# Patient Record
Sex: Male | Born: 1950 | Race: White | Hispanic: No | Marital: Single | Smoking: Never smoker
Health system: Southern US, Community
[De-identification: ages and names within clinical notes are randomized; demographics above are authoritative.]

## PROBLEM LIST (undated history)

## (undated) DIAGNOSIS — K5792 Diverticulitis of intestine, part unspecified, without perforation or abscess without bleeding: Secondary | ICD-10-CM

## (undated) DIAGNOSIS — K5140 Inflammatory polyps of colon without complications: Secondary | ICD-10-CM

## (undated) DIAGNOSIS — E785 Hyperlipidemia, unspecified: Secondary | ICD-10-CM

## (undated) DIAGNOSIS — N5201 Erectile dysfunction due to arterial insufficiency: Secondary | ICD-10-CM

## (undated) DIAGNOSIS — Z789 Other specified health status: Secondary | ICD-10-CM

## (undated) DIAGNOSIS — Z860100 Personal history of colon polyps, unspecified: Secondary | ICD-10-CM

## (undated) DIAGNOSIS — M47812 Spondylosis without myelopathy or radiculopathy, cervical region: Secondary | ICD-10-CM

## (undated) DIAGNOSIS — J310 Chronic rhinitis: Secondary | ICD-10-CM

## (undated) DIAGNOSIS — N529 Male erectile dysfunction, unspecified: Secondary | ICD-10-CM

## (undated) DIAGNOSIS — M549 Dorsalgia, unspecified: Secondary | ICD-10-CM

## (undated) DIAGNOSIS — K219 Gastro-esophageal reflux disease without esophagitis: Secondary | ICD-10-CM

## (undated) DIAGNOSIS — N200 Calculus of kidney: Secondary | ICD-10-CM

## (undated) DIAGNOSIS — K5730 Diverticulosis of large intestine without perforation or abscess without bleeding: Secondary | ICD-10-CM

## (undated) DIAGNOSIS — D126 Benign neoplasm of colon, unspecified: Secondary | ICD-10-CM

## (undated) DIAGNOSIS — M19072 Primary osteoarthritis, left ankle and foot: Secondary | ICD-10-CM

## (undated) DIAGNOSIS — M5136 Other intervertebral disc degeneration, lumbar region: Secondary | ICD-10-CM

## (undated) DIAGNOSIS — R55 Syncope and collapse: Secondary | ICD-10-CM

## (undated) DIAGNOSIS — E119 Type 2 diabetes mellitus without complications: Secondary | ICD-10-CM

## (undated) DIAGNOSIS — I10 Essential (primary) hypertension: Secondary | ICD-10-CM

## (undated) DIAGNOSIS — M51369 Other intervertebral disc degeneration, lumbar region without mention of lumbar back pain or lower extremity pain: Secondary | ICD-10-CM

## (undated) DIAGNOSIS — I259 Chronic ischemic heart disease, unspecified: Secondary | ICD-10-CM

## (undated) DIAGNOSIS — R931 Abnormal findings on diagnostic imaging of heart and coronary circulation: Secondary | ICD-10-CM

## (undated) DIAGNOSIS — R079 Chest pain, unspecified: Secondary | ICD-10-CM

## (undated) HISTORY — DX: Male erectile dysfunction, unspecified: N52.9

## (undated) HISTORY — PX: ROTATOR CUFF REPAIR: SHX139

## (undated) HISTORY — DX: Other intervertebral disc degeneration, lumbar region without mention of lumbar back pain or lower extremity pain: M51.369

## (undated) HISTORY — PX: EYE SURGERY: SHX253

## (undated) HISTORY — DX: Inflammatory polyps of colon without complications: K51.40

## (undated) HISTORY — DX: Syncope and collapse: R55

## (undated) HISTORY — DX: Benign neoplasm of colon, unspecified: D12.6

## (undated) HISTORY — DX: Primary osteoarthritis, left ankle and foot: M19.072

## (undated) HISTORY — DX: Dorsalgia, unspecified: M54.9

## (undated) HISTORY — DX: Hyperlipidemia, unspecified: E78.5

## (undated) HISTORY — DX: Diverticulosis of large intestine without perforation or abscess without bleeding: K57.30

## (undated) HISTORY — DX: Abnormal findings on diagnostic imaging of heart and coronary circulation: R93.1

## (undated) HISTORY — PX: CARPAL TUNNEL RELEASE: SHX101

## (undated) HISTORY — DX: Other intervertebral disc degeneration, lumbar region: M51.36

## (undated) HISTORY — DX: Gastro-esophageal reflux disease without esophagitis: K21.9

## (undated) HISTORY — DX: Calculus of kidney: N20.0

## (undated) HISTORY — DX: Chronic rhinitis: J31.0

## (undated) HISTORY — DX: Chronic ischemic heart disease, unspecified: I25.9

## (undated) HISTORY — DX: Personal history of colonic polyps: Z86.010

## (undated) HISTORY — DX: Chest pain, unspecified: R07.9

## (undated) HISTORY — DX: Spondylosis without myelopathy or radiculopathy, cervical region: M47.812

## (undated) HISTORY — DX: Essential (primary) hypertension: I10

## (undated) HISTORY — DX: Other specified health status: Z78.9

## (undated) HISTORY — DX: Personal history of colon polyps, unspecified: Z86.0100

## (undated) HISTORY — DX: Erectile dysfunction due to arterial insufficiency: N52.01

---

## 2001-11-11 ENCOUNTER — Emergency Department (HOSPITAL_COMMUNITY): Admission: EM | Admit: 2001-11-11 | Discharge: 2001-11-12 | Payer: Self-pay | Admitting: *Deleted

## 2001-11-12 ENCOUNTER — Encounter: Payer: Self-pay | Admitting: *Deleted

## 2002-10-27 ENCOUNTER — Ambulatory Visit (HOSPITAL_COMMUNITY): Admission: RE | Admit: 2002-10-27 | Discharge: 2002-10-27 | Payer: Self-pay | Admitting: Gastroenterology

## 2002-10-27 ENCOUNTER — Encounter (INDEPENDENT_AMBULATORY_CARE_PROVIDER_SITE_OTHER): Payer: Self-pay | Admitting: *Deleted

## 2003-02-09 ENCOUNTER — Ambulatory Visit (HOSPITAL_COMMUNITY): Admission: RE | Admit: 2003-02-09 | Discharge: 2003-02-09 | Payer: Self-pay | Admitting: *Deleted

## 2003-02-09 ENCOUNTER — Encounter (INDEPENDENT_AMBULATORY_CARE_PROVIDER_SITE_OTHER): Payer: Self-pay | Admitting: Specialist

## 2003-09-10 ENCOUNTER — Emergency Department (HOSPITAL_COMMUNITY): Admission: EM | Admit: 2003-09-10 | Discharge: 2003-09-10 | Payer: Self-pay | Admitting: Emergency Medicine

## 2003-09-10 ENCOUNTER — Encounter: Payer: Self-pay | Admitting: *Deleted

## 2005-09-21 ENCOUNTER — Encounter: Admission: RE | Admit: 2005-09-21 | Discharge: 2005-09-21 | Payer: Self-pay | Admitting: Internal Medicine

## 2007-01-05 ENCOUNTER — Ambulatory Visit (HOSPITAL_BASED_OUTPATIENT_CLINIC_OR_DEPARTMENT_OTHER): Admission: RE | Admit: 2007-01-05 | Discharge: 2007-01-05 | Payer: Self-pay | Admitting: Orthopedic Surgery

## 2010-10-23 ENCOUNTER — Ambulatory Visit (HOSPITAL_COMMUNITY): Admission: RE | Admit: 2010-10-23 | Discharge: 2010-10-23 | Payer: Self-pay | Admitting: Gastroenterology

## 2011-04-24 NOTE — Op Note (Signed)
   NAME:  Christian Robles, Christian Robles                         ACCOUNT NO.:  [id number]   MEDICAL RECORD NO.:  [id number]                   PATIENT TYPE:  AMB   LOCATION:  ENDO                                 FACILITY:  [facility]   PHYSICIAN:  Danise Edge, M.D.                DATE OF BIRTH:  Jun 27, 1951   DATE OF PROCEDURE:  02/09/2003  DATE OF DISCHARGE:                                 OPERATIVE REPORT   PROCEDURE:  Flexible proctosigmoidoscopy with polypectomy.   INDICATIONS:  The patient is a 60 year old male born 05-01-2051.  Approximately  four months ago he underwent his first screening colonoscopy and a 2 cm  pedunculated neoplastic polyp was removed from the sigmoid colon at  approximately 25 cm from the anal verge.  There was no cancer within the  polyp pathologically.  Follow-up flexible proctosigmoidoscopy is performed  to ensure complete removal of this large sigmoid colon polyp.   DESCRIPTION OF PROCEDURE:  Anal inspection was normal.  Digital rectal  examination was normal.  The patient received Versed 7.5 mg intravenously  and Demerol 50 mg intravenously to achieve conscious sedation for the  procedure.  The Olympus adult colonoscope was introduced into the rectum and  easily advanced to approximately 70 cm from the anal verge, at which point  solid stool was encountered.  Colonic preparation for the exam today was  excellent.   Endoscopic appearance of the rectum and colon with the endoscope advanced to  approximately 70 cm from the anal verge was completely normal except for the  presence of a 0.5 mm sessile polyp removed with the cold biopsy forceps at  40 cm from the anal verge.                                               Danise Edge, M.D.    MJ/MEDQ  D:  02/09/2003  T:  02/10/2003  Job:  [id number]

## 2011-04-24 NOTE — Op Note (Signed)
   NAME:  Christian Robles, Christian Robles                         ACCOUNT NO.:  [id number]   MEDICAL RECORD NO.:  [id number]                   PATIENT TYPE:  AMB   LOCATION:  ENDO                                 FACILITY:  [facility]   PHYSICIAN:  Danise Edge, M.D.                DATE OF BIRTH:  [birth year]   DATE OF PROCEDURE:  10/27/2002  DATE OF DISCHARGE:                                 OPERATIVE REPORT   PROCEDURE:  Colonoscopy and polypectomy.   INDICATIONS:  The patient is a 60 year old male born 2051-02-21.  The patient  is scheduled to undergo his first screening colonoscopy with polypectomy to  prevent colon cancer.   ENDOSCOPIST:  Danise Edge, M.D.   PREMEDICATION:  Versed 10 mg, fentanyl 50 mcg.   ENDOSCOPE:  Olympus pediatric colonoscope.   DESCRIPTION OF PROCEDURE:  After obtaining informed consent, the patient was  placed in the left lateral decubitus position.  I administered intravenous  fentanyl and intravenous Versed to achieve conscious sedation for the  procedure.  The patient's blood pressure, oxygen saturation, and cardiac  rhythm were monitored throughout the procedure and documented in the medical  record.   Anal inspection normal.  Digital rectal exam normal.  The Olympus pediatric  video colonoscope was introduced into the rectum and advanced to the cecum.  Colonic preparation for the exam today was excellent.   Rectum normal.   Sigmoid colon and descending colon:  At approximately 25 cm from the anal  verge, a 2 cm pedunculated polyp was removed with the electrocautery snare  and a 1 mm sessile polyp was removed with the electrocautery snare.   Splenic flexure normal.   Transverse colon normal.   Hepatic flexure normal.   Ascending colon normal.   Cecum and ileocecal valve normal.    ASSESSMENT:  From the distal sigmoid colon, at approximately 25 cm from the  anal verge, a 2 cm pedunculated polyp was removed and a 1 mm sessile polyp  was removed.                                             Danise Edge, M.D.    MJ/MEDQ  D:  10/27/2002  T:  10/27/2002  Job:  [id number]   cc:   Thora Lance, M.D.  [address]  [place]  [place] [address]  Fax: [phone]

## 2011-04-24 NOTE — Op Note (Signed)
NAMEBRITT, Christian Robles NO.:  [id number]   MEDICAL RECORD NO.:  [id number]          PATIENT TYPE:  AMB   LOCATION:  DSC                          FACILITY:  [facility]   PHYSICIAN:  Loreta Ave, M.D. DATE OF BIRTH:  [birth year]   DATE OF PROCEDURE:  01/05/2007  DATE OF DISCHARGE:                               OPERATIVE REPORT   PREOPERATIVE DIAGNOSES:  1. Right shoulder complete rotator cuff tear, status post repair of      rotator cuff 20 years ago.  2. Recurrent impingement and distal clavicle osteolysis.   POSTOPERATIVE DIAGNOSES:  1. Right shoulder complete rotator cuff tear, status post repair of      rotator cuff 20 years ago.  2. Recurrent impingement and distal clavicle osteolysis.  3. With some grade 2 and 3 chondral changes, humeral head, as well as      glenoid, and attritional tearing, anterior and inferior labrum.  4. Complete evulsion of supraspinatus tendon, but still repairable.   PROCEDURES:  1. Right shoulder exam under anesthesia, arthroscopy, debridement of      labrum, rotator cuff and glenohumeral joint.  2. Debridement of rotator cuff.  3. Acromioplasty and coracoacromial ligament release.  4. Excision of distal clavicle.  5. Mini-open repair of rotator cuff tear with FiberWire suture,      Concept repair System.   SURGEON:  Loreta Ave, M.D.   ASSISTANT:  Genene Churn. Denton Meek., present throughout the entire case.   ANESTHESIA:  General.   BLOOD LOSS:  Minimal.   SPECIMENS:  None.   CULTURES:  None.   COMPLICATIONS:  None.   DRESSING:  Soft compressive with shoulder immobilizer.   DESCRIPTION OF PROCEDURES:  The patient was brought to the operating  room and placed on the operating table in supine position.  After  adequate anesthesia had been obtained, the right shoulder was examined.  Full motion and good stability.  Placed in a beach-chair position on a  shoulder positioner and prepped and draped in the usual sterile  fashion.  Anterior, posterior and lateral portals.  Shoulder entered with a blunt  obturator and distended and inspected.  Some grade 2, mild grade 3  changes on the bottom of the glenoid and on the top of the humerus; all  of this debrided.  Fair amount of attritional tearing of the labrum,  circumferential, debrided.  Biceps tendon and biceps anchor intact.  Complete evulsion, appearing relatively subacute, of the entire  supraspinatus tendon.  Retracted a little bit more than a centimeter,  but still mobile.  Although a lot of intratendinous tearing, again, I  was very pleased that this was mobile and able to be brought out  laterally.  It was debrided,  cannula redirected subacromially.  Type 2  to 3 acromion.  Bursa resected, cuff debrided, acromioplasty to a type 1  acromion with shaver and a high-speed bur.  CA ligament released with  cautery.  Grade 4 changes and spurring of the [facility] joint.  With excision  of the distal clavicle and peri-articular spurs with a shaver and a high-  speed bur, adequacy of debridement, decompression and excision confirmed  viewing from all portals.  Instruments and fluid removed.  The portals  were closed with nylon.  A deltoid-splitting incision through the  lateral portal, avoiding the old previous anterior incision.  Subacromial space accessed.  Adequacy of decompression confirmed  digitally.  The wound irrigated.  Some roughening and bony overgrowth to  the tuberosity taken down flat with a bur.  The cuff was then mobilized  and captured with a weave, FiberWire sutures; they were all placed well  medial back into good healthy tissue.  This captured the cuff well.  It  was then brought through the series of drill holes that were then  brought out through the lateral side of the humerus utilizing the  Concept Repair System.  Arm abducted.  Sutures were all tied over the  bony bridge.  This yielded a nice, firm, watertight closure of the cuff  with  full passive motion without undue tension.  Wound irrigated.  Deltoid closed with Vicryl and the skin and subcutaneous tissues with  Vicryl.  Margins of the wound injected with Marcaine.  Sterile  compressive dressing applied.  Shoulder immobilizer applied.  Anesthesia  reversed.  Brought to the recovery room.  Tolerated surgery well with no  complications.      Loreta Ave, M.D.  Electronically Signed     DFM/MEDQ  D:  01/05/2007  T:  01/06/2007  Job:  [id number]

## 2014-05-30 ENCOUNTER — Encounter: Payer: Self-pay | Admitting: *Deleted

## 2014-05-30 DIAGNOSIS — R079 Chest pain, unspecified: Secondary | ICD-10-CM

## 2014-05-30 DIAGNOSIS — R55 Syncope and collapse: Secondary | ICD-10-CM

## 2015-10-10 ENCOUNTER — Other Ambulatory Visit: Payer: Self-pay | Admitting: Gastroenterology

## 2015-10-28 ENCOUNTER — Encounter (HOSPITAL_COMMUNITY): Payer: Self-pay | Admitting: *Deleted

## 2015-10-28 NOTE — Progress Notes (Signed)
10-28-15 1155 Spoke with "Denell" Dr. Durenda Age office to informs multiple phones messages x 6, unable to get pt to return a call, now all today attempts line busy. She stated she would attempt to reach pt and have him call us.

## 2015-10-29 ENCOUNTER — Ambulatory Visit (HOSPITAL_COMMUNITY)
Admission: RE | Admit: 2015-10-29 | Discharge: 2015-10-29 | Disposition: A | Discharge status: Home / Self Care | Payer: 59 | Source: Ambulatory Visit | Attending: Gastroenterology | Admitting: Gastroenterology

## 2015-10-29 ENCOUNTER — Encounter (HOSPITAL_COMMUNITY): Payer: Self-pay

## 2015-10-29 ENCOUNTER — Encounter (HOSPITAL_COMMUNITY)
Admission: RE | Disposition: A | Discharge status: Home / Self Care | Payer: Self-pay | Source: Ambulatory Visit | Attending: Gastroenterology

## 2015-10-29 ENCOUNTER — Ambulatory Visit (HOSPITAL_COMMUNITY): Payer: 59 | Admitting: Anesthesiology

## 2015-10-29 DIAGNOSIS — K219 Gastro-esophageal reflux disease without esophagitis: Secondary | ICD-10-CM | POA: Diagnosis not present

## 2015-10-29 DIAGNOSIS — M1990 Unspecified osteoarthritis, unspecified site: Secondary | ICD-10-CM | POA: Diagnosis not present

## 2015-10-29 DIAGNOSIS — D123 Benign neoplasm of transverse colon: Secondary | ICD-10-CM | POA: Insufficient documentation

## 2015-10-29 DIAGNOSIS — Z1211 Encounter for screening for malignant neoplasm of colon: Secondary | ICD-10-CM | POA: Insufficient documentation

## 2015-10-29 DIAGNOSIS — K635 Polyp of colon: Secondary | ICD-10-CM | POA: Diagnosis not present

## 2015-10-29 DIAGNOSIS — Z86010 Personal history of colonic polyps: Secondary | ICD-10-CM | POA: Diagnosis not present

## 2015-10-29 HISTORY — PX: COLONOSCOPY WITH PROPOFOL: SHX5780

## 2015-10-29 SURGERY — COLONOSCOPY WITH PROPOFOL
Anesthesia: Monitor Anesthesia Care

## 2015-10-29 MED ORDER — PROPOFOL 10 MG/ML IV BOLUS
INTRAVENOUS | Status: DC | PRN
Start: 2015-10-29 — End: 2015-10-29
  Administered 2015-10-29 (×3): 20 mg via INTRAVENOUS

## 2015-10-29 MED ORDER — PROPOFOL 500 MG/50ML IV EMUL
INTRAVENOUS | Status: DC | PRN
  Administered 2015-10-29: 200 ug/kg/min via INTRAVENOUS

## 2015-10-29 MED ORDER — LACTATED RINGERS IV SOLN
INTRAVENOUS | Status: DC
  Administered 2015-10-29: 1000 mL via INTRAVENOUS

## 2015-10-29 MED ORDER — SODIUM CHLORIDE 0.9 % IV SOLN: INTRAVENOUS | Status: DC

## 2015-10-29 MED ORDER — PROPOFOL 10 MG/ML IV BOLUS
INTRAVENOUS | Status: AC
Start: 2015-10-29 — End: 2015-10-29
  Filled 2015-10-29: qty 40

## 2015-10-29 MED ORDER — LIDOCAINE HCL (CARDIAC) 20 MG/ML IV SOLN
INTRAVENOUS | Status: AC
  Filled 2015-10-29: qty 15

## 2015-10-29 MED ORDER — PHENYLEPHRINE HCL 10 MG/ML IJ SOLN
INTRAMUSCULAR | Status: DC | PRN
  Administered 2015-10-29: 80 ug via INTRAVENOUS

## 2015-10-29 MED ORDER — PROPOFOL 10 MG/ML IV BOLUS
INTRAVENOUS | Status: AC
  Filled 2015-10-29: qty 20

## 2015-10-29 SURGICAL SUPPLY — 21 items

## 2015-10-29 NOTE — Anesthesia Preprocedure Evaluation (Addendum)
Anesthesia Evaluation  Patient identified by MRN, date of birth, ID band Patient awake    Reviewed: Allergy & Precautions, NPO status , Patient's Chart, lab work & pertinent test results  Airway Mallampati: II  TM Distance: >3 FB Neck ROM: Full    Dental  (+) Teeth Intact, Dental Advisory Given   Pulmonary neg pulmonary ROS,    Pulmonary exam normal breath sounds clear to auscultation       Cardiovascular Exercise Tolerance: Good (-) hypertension(-) angina(-) CAD and (-) CHF negative cardio ROS Normal cardiovascular exam Rhythm:Regular Rate:Normal     Neuro/Psych History of syncope negative psych ROS   GI/Hepatic negative GI ROS, Neg liver ROS, GERD  Medicated,  Endo/Other  negative endocrine ROS  Renal/GU negative Renal ROS     Musculoskeletal  (+) Arthritis , Osteoarthritis,    Abdominal   Peds  Hematology negative hematology ROS (+)   Anesthesia Other Findings Day of surgery medications reviewed with the patient.  Reproductive/Obstetrics                            Anesthesia Physical Anesthesia Plan  ASA: II  Anesthesia Plan: MAC   Post-op Pain Management:    Induction: Intravenous  Airway Management Planned: Nasal Cannula  Additional Equipment:   Intra-op Plan:   Post-operative Plan:   Informed Consent: I have reviewed the patients History and Physical, chart, labs and discussed the procedure including the risks, benefits and alternatives for the proposed anesthesia with the patient or authorized representative who has indicated his/her understanding and acceptance.   Dental advisory given  Plan Discussed with: CRNA and Anesthesiologist  Anesthesia Plan Comments: (Discussed risks/benefits/alternatives to MAC sedation including need for ventilatory support, hypotension, need for conversion to general anesthesia.  All patient questions answered.  Patient wished to  proceed.)        Anesthesia Quick Evaluation

## 2015-10-29 NOTE — Op Note (Signed)
Procedure: Surveillance colonoscopy. 10/23/2010 colonoscopy performed with removal of a 5 mm adenomatous colon polyp  Endoscopist: Earle Gell  Premedication: Propofol administered by anesthesia  Procedure: The patient was placed in the left lateral decubitus position. Anal inspection and digital rectal exam were normal. The Pentax pediatric colonoscope was introduced into the rectum and advanced to the cecum. A normal-appearing appendiceal orifice and ileocecal valve were identified. Colonic preparation for the exam today was good. Withdrawal time was 12 minutes  Rectum. Normal. Retroflexed view of the distal rectum was normal  Sigmoid colon. A 3 mm sessile polyp was removed with the cold biopsy forceps  Descending colon. Normal  Splenic flexure. Normal  Transverse colon. A 3 mm sessile polyp was removed from the mid transverse colon with the cold biopsy forceps  Hepatic flexure. A 5 mm sessile polyp was removed with the cold snare  Ascending colon. Normal  Cecum and ileocecal valve. Normal  Assessment: A diminutive polyp was removed from the sigmoid colon, a diminutive polyp was removed from the transverse colon, a small polyp was removed from the hepatic flexure.  Recommendation: Schedule surveillance colonoscopy in 5 years

## 2015-10-29 NOTE — Anesthesia Postprocedure Evaluation (Signed)
Anesthesia Post Note  Patient: Christian Robles  Procedure(s) Performed: Procedure(s) (LRB): COLONOSCOPY WITH PROPOFOL (N/A)  Patient location during evaluation: PACU Anesthesia Type: MAC Level of consciousness: awake and alert Pain management: pain level controlled Vital Signs Assessment: post-procedure vital signs reviewed and stable Respiratory status: spontaneous breathing, nonlabored ventilation, respiratory function stable and patient connected to nasal cannula oxygen Cardiovascular status: stable and blood pressure returned to baseline Anesthetic complications: no    Last Vitals:  Filed Vitals:   10/29/15 1010 10/29/15 1020  BP: 103/76 117/71  Pulse: 65 62  Temp:    Resp: 24 14    Last Pain: There were no vitals filed for this visit.               Catalina Gravel

## 2015-10-29 NOTE — H&P (Signed)
  Procedure: Surveillance colonoscopy. 10/23/2010 colonoscopy was performed with removal of a 5 mm adenomatous colon polyp  History: The patient is a 64 year old male born Apr 12, 1951. He is scheduled to undergo a surveillance colonoscopy today.  Medication allergies: None  Past medical history: Bilateral carpal tunnel release surgeries. Bilateral rotator cuff surgeries. Gastroesophageal reflux. History of adenomatous colon polyps. Kidney stones.  Exam: The patient is alert and lying comfortably on the endoscopy stretcher. Abdomen is soft and nontender to palpation. Lungs are clear to auscultation. Cardiac exam reveals a regular rhythm.  Plan: Proceed with surveillance colonoscopy

## 2015-10-29 NOTE — Transfer of Care (Signed)
Immediate Anesthesia Transfer of Care Note  Patient: Christian Robles  Procedure(s) Performed: Procedure(s): COLONOSCOPY WITH PROPOFOL (N/A)  Patient Location: PACU  Anesthesia Type:MAC  Level of Consciousness: awake, alert  and oriented  Airway & Oxygen Therapy: Patient Spontanous Breathing and Patient connected to face mask oxygen  Post-op Assessment: Report given to RN and Post -op Vital signs reviewed and stable  Post vital signs: Reviewed and stable  Last Vitals:  Filed Vitals:   10/29/15 0853  BP: 148/84  Pulse: 68  Temp: 37.1 C  Resp: 22    Complications: No apparent anesthesia complications

## 2015-10-29 NOTE — Discharge Instructions (Signed)
Colonoscopy, Care After °These instructions give you information on caring for yourself after your procedure. Your doctor may also give you more specific instructions. Call your doctor if you have any problems or questions after your procedure. °HOME CARE °· Do not drive for 24 hours. °· Do not sign important papers or use machinery for 24 hours. °· You may shower. °· You may go back to your usual activities, but go slower for the first 24 hours. °· Take rest breaks often during the first 24 hours. °· Walk around or use warm packs on your belly (abdomen) if you have belly cramping or gas. °· Drink enough fluids to keep your pee (urine) clear or pale yellow. °· Resume your normal diet. Avoid heavy or fried foods. °· Avoid drinking alcohol for 24 hours or as told by your doctor. °· Only take medicines as told by your doctor. °If a tissue sample (biopsy) was taken during the procedure:  °· Do not take aspirin or blood thinners for 7 days, or as told by your doctor. °· Do not drink alcohol for 7 days, or as told by your doctor. °· Eat soft foods for the first 24 hours. °GET HELP IF: °You still have a small amount of blood in your poop (stool) 2-3 days after the procedure. °GET HELP RIGHT AWAY IF: °· You have more than a small amount of blood in your poop. °· You see clumps of tissue (blood clots) in your poop. °· Your belly is puffy (swollen). °· You feel sick to your stomach (nauseous) or throw up (vomit). °· You have a fever. °· You have belly pain that gets worse and medicine does not help. °MAKE SURE YOU: °· Understand these instructions. °· Will watch your condition. °· Will get help right away if you are not doing well or get worse. °  °This information is not intended to replace advice given to you by your health care provider. Make sure you discuss any questions you have with your health care provider. °  °Document Released: 12/26/2010 Document Revised: 11/28/2013 Document Reviewed: 07/31/2013 °Elsevier  Interactive Patient Education ©2016 [employer] ° °Moderate Conscious Sedation, Adult, Care After °Refer to this sheet in the next few weeks. These instructions provide you with information on caring for yourself after your procedure. Your health care provider may also give you more specific instructions. Your treatment has been planned according to current medical practices, but problems sometimes occur. Call your health care provider if you have any problems or questions after your procedure. °WHAT TO EXPECT AFTER THE PROCEDURE  °After your procedure: °· You may feel sleepy, clumsy, and have poor balance for several hours. °· Vomiting may occur if you eat too soon after the procedure. °HOME CARE INSTRUCTIONS °· Do not participate in any activities where you could become injured for at least 24 hours. Do not: °¨ Drive. °¨ Swim. °¨ Ride a bicycle. °¨ Operate heavy machinery. °¨ Cook. °¨ Use power tools. °¨ Climb ladders. °¨ Work from a high place. °· Do not make important decisions or sign legal documents until you are improved. °· If you vomit, drink water, juice, or soup when you can drink without vomiting. Make sure you have little or no nausea before eating solid foods. °· Only take over-the-counter or prescription medicines for pain, discomfort, or fever as directed by your health care provider. °· Make sure you and your family fully understand everything about the medicines given to you, including what side effects may occur. °·   You should not drink alcohol, take sleeping pills, or take medicines that cause drowsiness for at least 24 hours. °· If you smoke, do not smoke without supervision. °· If you are feeling better, you may resume normal activities 24 hours after you were sedated. °· Keep all appointments with your health care provider. °SEEK MEDICAL CARE IF: °· Your skin is pale or bluish in color. °· You continue to feel nauseous or vomit. °· Your pain is getting worse and is not helped by  medicine. °· You have bleeding or swelling. °· You are still sleepy or feeling clumsy after 24 hours. °SEEK IMMEDIATE MEDICAL CARE IF: °· You develop a rash. °· You have difficulty breathing. °· You develop any type of allergic problem. °· You have a fever. °MAKE SURE YOU: °· Understand these instructions. °· Will watch your condition. °· Will get help right away if you are not doing well or get worse. °  °This information is not intended to replace advice given to you by your health care provider. Make sure you discuss any questions you have with your health care provider. °  °Document Released: 09/13/2013 Document Revised: 12/14/2014 Document Reviewed: 09/13/2013 °Elsevier Interactive Patient Education ©2016 [employer] ° ° °

## 2015-10-30 ENCOUNTER — Encounter (HOSPITAL_COMMUNITY): Payer: Self-pay | Admitting: Gastroenterology

## 2017-06-10 DIAGNOSIS — Z7984 Long term (current) use of oral hypoglycemic drugs: Secondary | ICD-10-CM | POA: Diagnosis not present

## 2017-06-10 DIAGNOSIS — E119 Type 2 diabetes mellitus without complications: Secondary | ICD-10-CM | POA: Diagnosis not present

## 2017-06-14 DIAGNOSIS — Z7984 Long term (current) use of oral hypoglycemic drugs: Secondary | ICD-10-CM | POA: Diagnosis not present

## 2017-06-14 DIAGNOSIS — E119 Type 2 diabetes mellitus without complications: Secondary | ICD-10-CM | POA: Diagnosis not present

## 2017-06-14 DIAGNOSIS — E1165 Type 2 diabetes mellitus with hyperglycemia: Secondary | ICD-10-CM | POA: Diagnosis not present

## 2018-02-25 DIAGNOSIS — H905 Unspecified sensorineural hearing loss: Secondary | ICD-10-CM | POA: Diagnosis not present

## 2018-03-30 DIAGNOSIS — Z1389 Encounter for screening for other disorder: Secondary | ICD-10-CM | POA: Diagnosis not present

## 2018-03-30 DIAGNOSIS — Z23 Encounter for immunization: Secondary | ICD-10-CM | POA: Diagnosis not present

## 2018-03-30 DIAGNOSIS — E119 Type 2 diabetes mellitus without complications: Secondary | ICD-10-CM | POA: Diagnosis not present

## 2018-03-30 DIAGNOSIS — M19072 Primary osteoarthritis, left ankle and foot: Secondary | ICD-10-CM | POA: Diagnosis not present

## 2018-03-30 DIAGNOSIS — R079 Chest pain, unspecified: Secondary | ICD-10-CM | POA: Diagnosis not present

## 2018-03-30 DIAGNOSIS — Z125 Encounter for screening for malignant neoplasm of prostate: Secondary | ICD-10-CM | POA: Diagnosis not present

## 2018-03-30 DIAGNOSIS — M47812 Spondylosis without myelopathy or radiculopathy, cervical region: Secondary | ICD-10-CM | POA: Diagnosis not present

## 2018-03-30 DIAGNOSIS — K219 Gastro-esophageal reflux disease without esophagitis: Secondary | ICD-10-CM | POA: Diagnosis not present

## 2018-03-30 DIAGNOSIS — Z0000 Encounter for general adult medical examination without abnormal findings: Secondary | ICD-10-CM | POA: Diagnosis not present

## 2018-04-15 DIAGNOSIS — M7711 Lateral epicondylitis, right elbow: Secondary | ICD-10-CM | POA: Diagnosis not present

## 2018-04-15 DIAGNOSIS — M7701 Medial epicondylitis, right elbow: Secondary | ICD-10-CM | POA: Diagnosis not present

## 2018-04-18 DIAGNOSIS — M778 Other enthesopathies, not elsewhere classified: Secondary | ICD-10-CM | POA: Diagnosis not present

## 2018-04-20 DIAGNOSIS — M25521 Pain in right elbow: Secondary | ICD-10-CM | POA: Diagnosis not present

## 2018-04-22 DIAGNOSIS — M25521 Pain in right elbow: Secondary | ICD-10-CM | POA: Diagnosis not present

## 2018-05-12 DIAGNOSIS — G8918 Other acute postprocedural pain: Secondary | ICD-10-CM | POA: Diagnosis not present

## 2018-05-12 DIAGNOSIS — M66821 Spontaneous rupture of other tendons, right upper arm: Secondary | ICD-10-CM | POA: Diagnosis not present

## 2018-05-12 DIAGNOSIS — M66321 Spontaneous rupture of flexor tendons, right upper arm: Secondary | ICD-10-CM | POA: Diagnosis not present

## 2018-05-23 DIAGNOSIS — M66821 Spontaneous rupture of other tendons, right upper arm: Secondary | ICD-10-CM | POA: Diagnosis not present

## 2018-06-06 DIAGNOSIS — E119 Type 2 diabetes mellitus without complications: Secondary | ICD-10-CM | POA: Diagnosis not present

## 2018-06-20 DIAGNOSIS — M66821 Spontaneous rupture of other tendons, right upper arm: Secondary | ICD-10-CM | POA: Diagnosis not present

## 2018-06-22 DIAGNOSIS — M6281 Muscle weakness (generalized): Secondary | ICD-10-CM | POA: Diagnosis not present

## 2018-06-22 DIAGNOSIS — M66821 Spontaneous rupture of other tendons, right upper arm: Secondary | ICD-10-CM | POA: Diagnosis not present

## 2018-07-01 DIAGNOSIS — M6281 Muscle weakness (generalized): Secondary | ICD-10-CM | POA: Diagnosis not present

## 2018-07-01 DIAGNOSIS — M66821 Spontaneous rupture of other tendons, right upper arm: Secondary | ICD-10-CM | POA: Diagnosis not present

## 2018-07-01 DIAGNOSIS — M25521 Pain in right elbow: Secondary | ICD-10-CM | POA: Diagnosis not present

## 2018-07-08 DIAGNOSIS — M66821 Spontaneous rupture of other tendons, right upper arm: Secondary | ICD-10-CM | POA: Diagnosis not present

## 2018-07-08 DIAGNOSIS — M25521 Pain in right elbow: Secondary | ICD-10-CM | POA: Diagnosis not present

## 2018-07-08 DIAGNOSIS — M6281 Muscle weakness (generalized): Secondary | ICD-10-CM | POA: Diagnosis not present

## 2018-07-15 DIAGNOSIS — M66821 Spontaneous rupture of other tendons, right upper arm: Secondary | ICD-10-CM | POA: Diagnosis not present

## 2018-07-15 DIAGNOSIS — M25521 Pain in right elbow: Secondary | ICD-10-CM | POA: Diagnosis not present

## 2018-07-15 DIAGNOSIS — M6281 Muscle weakness (generalized): Secondary | ICD-10-CM | POA: Diagnosis not present

## 2018-07-22 DIAGNOSIS — M6281 Muscle weakness (generalized): Secondary | ICD-10-CM | POA: Diagnosis not present

## 2018-07-22 DIAGNOSIS — M25521 Pain in right elbow: Secondary | ICD-10-CM | POA: Diagnosis not present

## 2018-07-22 DIAGNOSIS — M66821 Spontaneous rupture of other tendons, right upper arm: Secondary | ICD-10-CM | POA: Diagnosis not present

## 2018-07-29 DIAGNOSIS — M6281 Muscle weakness (generalized): Secondary | ICD-10-CM | POA: Diagnosis not present

## 2018-07-29 DIAGNOSIS — M66821 Spontaneous rupture of other tendons, right upper arm: Secondary | ICD-10-CM | POA: Diagnosis not present

## 2018-07-29 DIAGNOSIS — M25521 Pain in right elbow: Secondary | ICD-10-CM | POA: Diagnosis not present

## 2018-08-01 DIAGNOSIS — M79641 Pain in right hand: Secondary | ICD-10-CM | POA: Diagnosis not present

## 2018-08-05 DIAGNOSIS — M25521 Pain in right elbow: Secondary | ICD-10-CM | POA: Diagnosis not present

## 2018-08-05 DIAGNOSIS — M6281 Muscle weakness (generalized): Secondary | ICD-10-CM | POA: Diagnosis not present

## 2018-08-05 DIAGNOSIS — M66821 Spontaneous rupture of other tendons, right upper arm: Secondary | ICD-10-CM | POA: Diagnosis not present

## 2018-09-22 ENCOUNTER — Emergency Department (HOSPITAL_COMMUNITY): Payer: Medicare Other

## 2018-09-22 ENCOUNTER — Other Ambulatory Visit: Payer: Self-pay

## 2018-09-22 ENCOUNTER — Emergency Department (HOSPITAL_COMMUNITY)
Admission: EM | Admit: 2018-09-22 | Discharge: 2018-09-22 | Disposition: A | Discharge status: Home / Self Care | Payer: Medicare Other | Attending: Emergency Medicine | Admitting: Emergency Medicine

## 2018-09-22 ENCOUNTER — Encounter (HOSPITAL_COMMUNITY): Payer: Self-pay

## 2018-09-22 DIAGNOSIS — Z7984 Long term (current) use of oral hypoglycemic drugs: Secondary | ICD-10-CM | POA: Insufficient documentation

## 2018-09-22 DIAGNOSIS — Z79899 Other long term (current) drug therapy: Secondary | ICD-10-CM | POA: Diagnosis not present

## 2018-09-22 DIAGNOSIS — N200 Calculus of kidney: Secondary | ICD-10-CM | POA: Diagnosis not present

## 2018-09-22 DIAGNOSIS — E119 Type 2 diabetes mellitus without complications: Secondary | ICD-10-CM | POA: Insufficient documentation

## 2018-09-22 DIAGNOSIS — R1012 Left upper quadrant pain: Secondary | ICD-10-CM | POA: Diagnosis present

## 2018-09-22 DIAGNOSIS — N132 Hydronephrosis with renal and ureteral calculous obstruction: Secondary | ICD-10-CM | POA: Diagnosis not present

## 2018-09-22 HISTORY — DX: Calculus of kidney: N20.0

## 2018-09-22 HISTORY — DX: Diverticulitis of intestine, part unspecified, without perforation or abscess without bleeding: K57.92

## 2018-09-22 HISTORY — DX: Type 2 diabetes mellitus without complications: E11.9

## 2018-09-22 LAB — COMPREHENSIVE METABOLIC PANEL
ALK PHOS: 72 U/L (ref 38–126)
ALT: 26 U/L (ref 0–44)
ANION GAP: 13 (ref 5–15)
AST: 28 U/L (ref 15–41)
Albumin: 4.4 g/dL (ref 3.5–5.0)
BILIRUBIN TOTAL: 1 mg/dL (ref 0.3–1.2)
BUN: 15 mg/dL (ref 8–23)
CALCIUM: 9.5 mg/dL (ref 8.9–10.3)
CO2: 25 mmol/L (ref 22–32)
CREATININE: 1.03 mg/dL (ref 0.61–1.24)
Chloride: 101 mmol/L (ref 98–111)
GFR calc non Af Amer: >60 mL/min (ref >60)
Glucose, Bld: 202 mg/dL — ABNORMAL HIGH (ref 70–99)
Potassium: 3.8 mmol/L (ref 3.5–5.1)
Sodium: 139 mmol/L (ref 135–145)
TOTAL PROTEIN: 7.5 g/dL (ref 6.5–8.1)

## 2018-09-22 LAB — URINALYSIS, ROUTINE W REFLEX MICROSCOPIC
Bacteria, UA: NONE SEEN
Bilirubin Urine: NEGATIVE
GLUCOSE, UA: 50 mg/dL — AB
KETONES UR: NEGATIVE mg/dL
LEUKOCYTES UA: NEGATIVE
Nitrite: NEGATIVE
Protein, ur: NEGATIVE mg/dL
SPECIFIC GRAVITY, URINE: 1.015 (ref 1.005–1.030)
pH: 5 (ref 5.0–8.0)

## 2018-09-22 LAB — CBC
HCT: 47.5 % (ref 39.0–52.0)
Hemoglobin: 15.7 g/dL (ref 13.0–17.0)
MCH: 30.8 pg (ref 26.0–34.0)
MCHC: 33.1 g/dL (ref 30.0–36.0)
MCV: 93.3 fL (ref 80.0–100.0)
NRBC: 0 % (ref 0.0–0.2)
PLATELETS: 199 10*3/uL (ref 150–400)
RBC: 5.09 MIL/uL (ref 4.22–5.81)
RDW: 11.5 % (ref 11.5–15.5)
WBC: 7.1 10*3/uL (ref 4.0–10.5)

## 2018-09-22 LAB — LIPASE, BLOOD: Lipase: 30 U/L (ref 11–51)

## 2018-09-22 MED ORDER — OXYCODONE-ACETAMINOPHEN 5-325 MG PO TABS: 1.0000 | ORAL_TABLET | Freq: Four times a day (QID) | ORAL | 0 refills | Status: DC | PRN

## 2018-09-22 MED ORDER — HYDROMORPHONE HCL 1 MG/ML IJ SOLN
1.0000 mg | Freq: Once | INTRAMUSCULAR | Status: AC
  Administered 2018-09-22: 1 mg via INTRAVENOUS
  Filled 2018-09-22: qty 1

## 2018-09-22 MED ORDER — ONDANSETRON HCL 4 MG/2ML IJ SOLN
4.0000 mg | Freq: Once | INTRAMUSCULAR | Status: AC
  Administered 2018-09-22: 4 mg via INTRAVENOUS
  Filled 2018-09-22: qty 2

## 2018-09-22 MED ORDER — TAMSULOSIN HCL 0.4 MG PO CAPS: 0.4000 mg | ORAL_CAPSULE | Freq: Every day | ORAL | 0 refills | Status: AC

## 2018-09-22 MED ORDER — OXYCODONE-ACETAMINOPHEN 5-325 MG PO TABS
2.0000 | ORAL_TABLET | Freq: Once | ORAL | Status: AC
  Administered 2018-09-22: 2 via ORAL
  Filled 2018-09-22: qty 2

## 2018-09-22 MED ORDER — ONDANSETRON 4 MG PO TBDP: 4.0000 mg | ORAL_TABLET | ORAL | 0 refills | Status: DC | PRN

## 2018-09-22 MED ORDER — IBUPROFEN 600 MG PO TABS: 600.0000 mg | ORAL_TABLET | Freq: Three times a day (TID) | ORAL | 0 refills | Status: DC | PRN

## 2018-09-22 MED ORDER — SODIUM CHLORIDE 0.9 % IV SOLN: 1000.0000 mL | INTRAVENOUS | Status: DC

## 2018-09-22 MED ORDER — TAMSULOSIN HCL 0.4 MG PO CAPS
0.4000 mg | ORAL_CAPSULE | Freq: Once | ORAL | Status: AC
  Administered 2018-09-22: 0.4 mg via ORAL
  Filled 2018-09-22: qty 1

## 2018-09-22 MED ORDER — SODIUM CHLORIDE 0.9 % IV BOLUS (SEPSIS)
1000.0000 mL | Freq: Once | INTRAVENOUS | Status: AC
  Administered 2018-09-22: 1000 mL via INTRAVENOUS

## 2018-09-22 MED ORDER — KETOROLAC TROMETHAMINE 30 MG/ML IJ SOLN
30.0000 mg | Freq: Once | INTRAMUSCULAR | Status: AC
  Administered 2018-09-22: 30 mg via INTRAVENOUS
  Filled 2018-09-22: qty 1

## 2018-09-22 NOTE — ED Triage Notes (Signed)
Pt endorses left flank pain radiating around to the left stomach x 1.5 hours.Marland Kitchen Hx of kidney stones and diverticulitis states it feels more like diverticulitis.

## 2018-09-22 NOTE — ED Notes (Signed)
Attempted to discharge pt, he states he is still 9-10/10 pain;  Strainers given for home use and instructions given as to use.

## 2018-09-22 NOTE — Discharge Instructions (Signed)
1.  Take Flomax daily.  Take ibuprofen every 8 hours for baseline pain control.  Take Percocet 1-2 every 6 hours for additional pain control.  Take Zofran as needed for control of nausea. 2.  Strain your urine to look for passage of a stone. 3.  Return to the emergency department if you have worsening pain or changing symptoms.

## 2018-09-22 NOTE — ED Provider Notes (Addendum)
[facility] EMERGENCY DEPARTMENT Provider Note   CSN: [id number] Arrival date & time: 09/22/18  0754     History   Chief Complaint Chief Complaint  Patient presents with  . Flank Pain    HPI Christian Robles is a 67 y.o. male.  HPI Patient had sudden onset of severe left flank pain about an hour and half prior to arrival.  He felt fine yesterday and was having no symptoms.  He reports no pain or burning with urination.  No fever no chills.  No testicular pain.  Last kidney stone was many years ago. Past Medical History:  Diagnosis Date  . Chest pain   . Diabetes mellitus without complication ([place])   . Diverticulitis   . Erectile dysfunction   . Esophageal reflux   . Kidney stone   . Syncope     Patient Active Problem List   Diagnosis Date Noted  . Chest pain   . Syncope     Past Surgical History:  Procedure Laterality Date  . CARPAL TUNNEL RELEASE    . COLONOSCOPY WITH PROPOFOL N/A 10/29/2015   Procedure: COLONOSCOPY WITH PROPOFOL;  Surgeon: Garlan Fair, MD;  Location: [facility];  Service: Endoscopy;  Laterality: N/A;  . EYE SURGERY    . ROTATOR CUFF REPAIR          Home Medications    Prior to Admission medications   Medication Sig Start Date End Date Taking? Authorizing Provider  metFORMIN (GLUCOPHAGE) 500 MG tablet Take 500 mg by mouth daily. 09/14/18  Yes [provider]  Multiple Vitamins-Minerals (MULTIVITAMIN PO) Take 1 packet by mouth See admin instructions. Multivitamin-minerals packet from [facility]   Yes [provider]  omeprazole (PRILOSEC OTC) 20 MG tablet Take 10 mg by mouth daily.   Yes [provider]  ibuprofen (ADVIL,MOTRIN) 600 MG tablet Take 1 tablet (600 mg total) by mouth every 8 (eight) hours as needed. 09/22/18   Charlesetta Shanks, MD  ondansetron (ZOFRAN ODT) 4 MG disintegrating tablet Take 1 tablet (4 mg total) by mouth every 4 (four) hours as needed for nausea or vomiting. 09/22/18    Charlesetta Shanks, MD  oxyCODONE-acetaminophen (PERCOCET) 5-325 MG tablet Take 1-2 tablets by mouth every 6 (six) hours as needed. 09/22/18   Charlesetta Shanks, MD  tamsulosin (FLOMAX) 0.4 MG CAPS capsule Take 1 capsule (0.4 mg total) by mouth daily. 09/22/18   Charlesetta Shanks, MD    Family History Family History  Family history unknown: Yes    Social History Social History   Tobacco Use  . Smoking status: Never Smoker  Substance Use Topics  . Alcohol use: No  . Drug use: No     Allergies   Patient has no known allergies.   Review of Systems Review of Systems 10 Systems reviewed and are negative for acute change except as noted in the HPI.   Physical Exam Updated Vital Signs BP (!) 145/90   Pulse 60   Temp 97.7 F (36.5 C) (Oral)   Resp 20   Ht 6' (1.829 m)   Wt 93.4 kg   SpO2 96%   BMI 27.94 kg/m   Physical Exam  Constitutional: He is oriented to person, place, and time. He appears well-developed and well-nourished.  Patient is clinically well in appearance.  He is nontoxic and alert.  Well-nourished well-developed.  He is up pacing around the room in pain.  HENT:  Head: Normocephalic and atraumatic.  Eyes: EOM are normal.  Cardiovascular: Normal rate, regular rhythm, normal heart sounds and intact distal pulses.  Pulmonary/Chest: Effort normal and breath sounds normal.  Abdominal: Soft. He exhibits no distension. There is no tenderness. There is no guarding.  Musculoskeletal: Normal range of motion. He exhibits no edema or tenderness.  Neurological: He is alert and oriented to person, place, and time. No cranial nerve deficit. He exhibits normal muscle tone. Coordination normal.  Skin: Skin is warm and dry.  Psychiatric: He has a normal mood and affect.     ED Treatments / Results  Labs (all labs ordered are listed, but only abnormal results are displayed) Labs Reviewed  COMPREHENSIVE METABOLIC PANEL - Abnormal; Notable for the following components:       Result Value   Glucose, Bld 202 (*)    All other components within normal limits  URINALYSIS, ROUTINE W REFLEX MICROSCOPIC - Abnormal; Notable for the following components:   Glucose, UA 50 (*)    Hgb urine dipstick LARGE (*)    RBC / HPF >50 (*)    All other components within normal limits  LIPASE, BLOOD  CBC    EKG None  Radiology Ct Renal Stone Study  Result Date: 09/22/2018 CLINICAL DATA:  Flank pain EXAM: CT ABDOMEN AND PELVIS WITHOUT CONTRAST TECHNIQUE: Multidetector CT imaging of the abdomen and pelvis was performed following the standard protocol without IV contrast. COMPARISON:  None. FINDINGS: Lower chest: Lung bases are clear. No effusions. Heart is normal size. Hepatobiliary: No focal hepatic abnormality. Gallbladder unremarkable. Pancreas: No focal abnormality or ductal dilatation. Spleen: No focal abnormality.  Normal size. Adrenals/Urinary Tract: Moderate left hydronephrosis due to 2 mm left UVJ stone. No hydronephrosis on the right. Areas of cortical thinning and scarring within the left kidney. Moderate perinephric stranding around the left kidney. Adrenal glands and urinary bladder unremarkable. Stomach/Bowel: Appendix is normal. Moderate stool throughout the colon. Stomach, large and small bowel grossly unremarkable. Vascular/Lymphatic: Aortic atherosclerosis. No enlarged abdominal or pelvic lymph nodes. Reproductive: Prostate enlargement. Other: No free fluid or free air. Musculoskeletal: Fusion across the SI joints bilaterally. Degenerative disc and facet disease in the lower lumbar spine. IMPRESSION: Moderate left hydronephrosis and hydroureter with perinephric stranding due to a 2 mm left UVJ stone. Moderate stool burden throughout the colon. Aortic atherosclerosis. Prostate enlargement. Electronically Signed   By: Rolm Baptise M.D.   On: 09/22/2018 09:32    Procedures Procedures (including critical care time)  Medications Ordered in ED Medications    oxyCODONE-acetaminophen (PERCOCET/ROXICET) 5-325 MG per tablet 2 tablet (has no administration in time range)  HYDROmorphone (DILAUDID) injection 1 mg (1 mg Intravenous Given 09/22/18 0842)  ketorolac (TORADOL) 30 MG/ML injection 30 mg (30 mg Intravenous Given 09/22/18 0842)  ondansetron (ZOFRAN) injection 4 mg (4 mg Intravenous Given 09/22/18 0842)  HYDROmorphone (DILAUDID) injection 1 mg (1 mg Intravenous Given 09/22/18 1013)     Initial Impression / Assessment and Plan / ED Course  I have reviewed the triage vital signs and the nursing notes.  Pertinent labs & imaging results that were available during my care of the patient were reviewed by me and considered in my medical decision making (see chart for details).  Clinical Course as of Sep 22 1406  Thu Sep 22, 2018  1044 Consult to urology ordered   [MP]  1053 Consult: Dr. Karsten Ro has reviewed the CT results.  Appropriate for starting the patient on tamsulosin and pain control with follow-up.  Anticipated the patient will pass the stone spontaneously.   [  MP]  1122 Patient was pain control.  He however has had a rebound and pain is now up at bedside again with fairly severe pain.  Will have to cancel discharge plan and administer fluids and additional narcotic pain control.   [MP]    Clinical Course User Index [MP] Charlesetta Shanks, MD   Patient arrived with typical presentation for kidney stone.  This is confirmed by CT.  Patient was hypertensive.  He was however significant pain.  He reports at baseline his pressures are fairly well controlled or just borderline for hypertension without medications.  He is going well before onset of the symptoms with no signs of endorgan damage.  Potentially appears secondary to severe pain.  It did improve once patient was well controlled for pain.  Plan will be for outpatient pain medications and follow-up with urology.  Patient reports he has an appointment with his primary care doctor upcoming  Monday.  Return precautions reviewed.  Final Clinical Impressions(s) / ED Diagnoses   Final diagnoses:  Kidney stone   Patient had a episode of rebound and pain at time of planned discharge.  Required additional pain control.  After observation and patient urinating freely, patient is now pain controlled and ready for discharge. ED Discharge Orders         Ordered    tamsulosin (FLOMAX) 0.4 MG CAPS capsule  Daily     09/22/18 1059    oxyCODONE-acetaminophen (PERCOCET) 5-325 MG tablet  Every 6 hours PRN     09/22/18 1059    ibuprofen (ADVIL,MOTRIN) 600 MG tablet  Every 8 hours PRN     09/22/18 1059    ondansetron (ZOFRAN ODT) 4 MG disintegrating tablet  Every 4 hours PRN     09/22/18 1059           Charlesetta Shanks, MD 09/22/18 1105    Charlesetta Shanks, MD 09/22/18 1408

## 2018-09-26 DIAGNOSIS — R030 Elevated blood-pressure reading, without diagnosis of hypertension: Secondary | ICD-10-CM | POA: Diagnosis not present

## 2018-09-26 DIAGNOSIS — E119 Type 2 diabetes mellitus without complications: Secondary | ICD-10-CM | POA: Diagnosis not present

## 2018-10-03 DIAGNOSIS — N401 Enlarged prostate with lower urinary tract symptoms: Secondary | ICD-10-CM | POA: Diagnosis not present

## 2018-10-03 DIAGNOSIS — R351 Nocturia: Secondary | ICD-10-CM | POA: Diagnosis not present

## 2018-10-03 DIAGNOSIS — N201 Calculus of ureter: Secondary | ICD-10-CM | POA: Diagnosis not present

## 2018-11-07 DIAGNOSIS — E119 Type 2 diabetes mellitus without complications: Secondary | ICD-10-CM | POA: Diagnosis not present

## 2018-11-07 DIAGNOSIS — Z9189 Other specified personal risk factors, not elsewhere classified: Secondary | ICD-10-CM | POA: Diagnosis not present

## 2018-11-07 DIAGNOSIS — I10 Essential (primary) hypertension: Secondary | ICD-10-CM | POA: Diagnosis not present

## 2019-06-28 DIAGNOSIS — Z7984 Long term (current) use of oral hypoglycemic drugs: Secondary | ICD-10-CM | POA: Diagnosis not present

## 2019-06-28 DIAGNOSIS — E119 Type 2 diabetes mellitus without complications: Secondary | ICD-10-CM | POA: Diagnosis not present

## 2019-06-28 DIAGNOSIS — Z0000 Encounter for general adult medical examination without abnormal findings: Secondary | ICD-10-CM | POA: Diagnosis not present

## 2019-06-28 DIAGNOSIS — I10 Essential (primary) hypertension: Secondary | ICD-10-CM | POA: Diagnosis not present

## 2019-06-28 DIAGNOSIS — Z1389 Encounter for screening for other disorder: Secondary | ICD-10-CM | POA: Diagnosis not present

## 2019-10-27 DIAGNOSIS — Z23 Encounter for immunization: Secondary | ICD-10-CM | POA: Diagnosis not present

## 2019-10-27 DIAGNOSIS — E1169 Type 2 diabetes mellitus with other specified complication: Secondary | ICD-10-CM | POA: Diagnosis not present

## 2019-10-27 DIAGNOSIS — I10 Essential (primary) hypertension: Secondary | ICD-10-CM | POA: Diagnosis not present

## 2019-10-27 DIAGNOSIS — K219 Gastro-esophageal reflux disease without esophagitis: Secondary | ICD-10-CM | POA: Diagnosis not present

## 2019-10-27 DIAGNOSIS — Z125 Encounter for screening for malignant neoplasm of prostate: Secondary | ICD-10-CM | POA: Diagnosis not present

## 2019-11-17 DIAGNOSIS — E119 Type 2 diabetes mellitus without complications: Secondary | ICD-10-CM | POA: Diagnosis not present

## 2020-02-28 DIAGNOSIS — I10 Essential (primary) hypertension: Secondary | ICD-10-CM | POA: Diagnosis not present

## 2020-02-28 DIAGNOSIS — E785 Hyperlipidemia, unspecified: Secondary | ICD-10-CM | POA: Diagnosis not present

## 2020-02-28 DIAGNOSIS — E119 Type 2 diabetes mellitus without complications: Secondary | ICD-10-CM | POA: Diagnosis not present

## 2020-02-28 DIAGNOSIS — Z7984 Long term (current) use of oral hypoglycemic drugs: Secondary | ICD-10-CM | POA: Diagnosis not present

## 2020-02-28 DIAGNOSIS — K219 Gastro-esophageal reflux disease without esophagitis: Secondary | ICD-10-CM | POA: Diagnosis not present

## 2020-05-21 ENCOUNTER — Other Ambulatory Visit (HOSPITAL_BASED_OUTPATIENT_CLINIC_OR_DEPARTMENT_OTHER): Payer: Self-pay | Admitting: Internal Medicine

## 2020-05-21 ENCOUNTER — Other Ambulatory Visit (HOSPITAL_COMMUNITY): Payer: Self-pay | Admitting: Internal Medicine

## 2020-05-21 ENCOUNTER — Other Ambulatory Visit: Payer: Self-pay | Admitting: Internal Medicine

## 2020-05-21 DIAGNOSIS — R42 Dizziness and giddiness: Secondary | ICD-10-CM

## 2020-05-21 DIAGNOSIS — G459 Transient cerebral ischemic attack, unspecified: Secondary | ICD-10-CM

## 2020-05-25 ENCOUNTER — Other Ambulatory Visit: Payer: Self-pay

## 2020-05-25 ENCOUNTER — Encounter (HOSPITAL_BASED_OUTPATIENT_CLINIC_OR_DEPARTMENT_OTHER): Payer: Self-pay

## 2020-05-25 ENCOUNTER — Ambulatory Visit (HOSPITAL_BASED_OUTPATIENT_CLINIC_OR_DEPARTMENT_OTHER)
Admission: RE | Admit: 2020-05-25 | Discharge: 2020-05-25 | Disposition: A | Discharge status: Home / Self Care | Payer: Medicare Other | Source: Ambulatory Visit | Attending: Internal Medicine | Admitting: Internal Medicine

## 2020-05-25 DIAGNOSIS — G459 Transient cerebral ischemic attack, unspecified: Secondary | ICD-10-CM

## 2020-05-25 DIAGNOSIS — R42 Dizziness and giddiness: Secondary | ICD-10-CM | POA: Diagnosis present

## 2020-12-13 DIAGNOSIS — Z1159 Encounter for screening for other viral diseases: Secondary | ICD-10-CM | POA: Diagnosis not present

## 2020-12-18 DIAGNOSIS — Z86010 Personal history of colonic polyps: Secondary | ICD-10-CM | POA: Diagnosis not present

## 2020-12-18 DIAGNOSIS — K621 Rectal polyp: Secondary | ICD-10-CM | POA: Diagnosis not present

## 2020-12-18 DIAGNOSIS — D123 Benign neoplasm of transverse colon: Secondary | ICD-10-CM | POA: Diagnosis not present

## 2020-12-18 DIAGNOSIS — K5730 Diverticulosis of large intestine without perforation or abscess without bleeding: Secondary | ICD-10-CM | POA: Diagnosis not present

## 2020-12-18 DIAGNOSIS — K648 Other hemorrhoids: Secondary | ICD-10-CM | POA: Diagnosis not present

## 2020-12-18 DIAGNOSIS — K5140 Inflammatory polyps of colon without complications: Secondary | ICD-10-CM | POA: Diagnosis not present

## 2020-12-20 DIAGNOSIS — K621 Rectal polyp: Secondary | ICD-10-CM | POA: Diagnosis not present

## 2020-12-20 DIAGNOSIS — D123 Benign neoplasm of transverse colon: Secondary | ICD-10-CM | POA: Diagnosis not present

## 2020-12-20 DIAGNOSIS — K5140 Inflammatory polyps of colon without complications: Secondary | ICD-10-CM | POA: Diagnosis not present

## 2021-02-17 DIAGNOSIS — I10 Essential (primary) hypertension: Secondary | ICD-10-CM | POA: Diagnosis not present

## 2021-02-17 DIAGNOSIS — K219 Gastro-esophageal reflux disease without esophagitis: Secondary | ICD-10-CM | POA: Diagnosis not present

## 2021-02-17 DIAGNOSIS — E119 Type 2 diabetes mellitus without complications: Secondary | ICD-10-CM | POA: Diagnosis not present

## 2021-02-17 DIAGNOSIS — E0869 Diabetes mellitus due to underlying condition with other specified complication: Secondary | ICD-10-CM | POA: Diagnosis not present

## 2021-02-17 DIAGNOSIS — E1169 Type 2 diabetes mellitus with other specified complication: Secondary | ICD-10-CM | POA: Diagnosis not present

## 2021-02-17 DIAGNOSIS — M19072 Primary osteoarthritis, left ankle and foot: Secondary | ICD-10-CM | POA: Diagnosis not present

## 2021-02-17 DIAGNOSIS — E785 Hyperlipidemia, unspecified: Secondary | ICD-10-CM | POA: Diagnosis not present

## 2021-02-17 DIAGNOSIS — M47812 Spondylosis without myelopathy or radiculopathy, cervical region: Secondary | ICD-10-CM | POA: Diagnosis not present

## 2021-02-19 DIAGNOSIS — H2513 Age-related nuclear cataract, bilateral: Secondary | ICD-10-CM | POA: Diagnosis not present

## 2021-03-18 ENCOUNTER — Other Ambulatory Visit: Payer: Self-pay | Admitting: Internal Medicine

## 2021-03-18 DIAGNOSIS — R079 Chest pain, unspecified: Secondary | ICD-10-CM | POA: Diagnosis not present

## 2021-03-18 DIAGNOSIS — I259 Chronic ischemic heart disease, unspecified: Secondary | ICD-10-CM

## 2021-03-18 DIAGNOSIS — Z789 Other specified health status: Secondary | ICD-10-CM | POA: Diagnosis not present

## 2021-03-18 DIAGNOSIS — E785 Hyperlipidemia, unspecified: Secondary | ICD-10-CM

## 2021-03-18 DIAGNOSIS — E119 Type 2 diabetes mellitus without complications: Secondary | ICD-10-CM | POA: Diagnosis not present

## 2021-03-18 DIAGNOSIS — I10 Essential (primary) hypertension: Secondary | ICD-10-CM | POA: Diagnosis not present

## 2021-04-03 ENCOUNTER — Other Ambulatory Visit: Payer: Self-pay

## 2021-04-03 ENCOUNTER — Ambulatory Visit
Admission: RE | Admit: 2021-04-03 | Discharge: 2021-04-03 | Disposition: A | Discharge status: Home / Self Care | Payer: No Typology Code available for payment source | Source: Ambulatory Visit | Attending: Internal Medicine | Admitting: Internal Medicine

## 2021-04-03 DIAGNOSIS — I259 Chronic ischemic heart disease, unspecified: Secondary | ICD-10-CM

## 2021-04-04 DIAGNOSIS — M47812 Spondylosis without myelopathy or radiculopathy, cervical region: Secondary | ICD-10-CM | POA: Diagnosis not present

## 2021-04-04 DIAGNOSIS — K219 Gastro-esophageal reflux disease without esophagitis: Secondary | ICD-10-CM | POA: Diagnosis not present

## 2021-04-04 DIAGNOSIS — M19072 Primary osteoarthritis, left ankle and foot: Secondary | ICD-10-CM | POA: Diagnosis not present

## 2021-04-04 DIAGNOSIS — I10 Essential (primary) hypertension: Secondary | ICD-10-CM | POA: Diagnosis not present

## 2021-04-04 DIAGNOSIS — I259 Chronic ischemic heart disease, unspecified: Secondary | ICD-10-CM | POA: Diagnosis not present

## 2021-04-04 DIAGNOSIS — E119 Type 2 diabetes mellitus without complications: Secondary | ICD-10-CM | POA: Diagnosis not present

## 2021-04-04 DIAGNOSIS — E785 Hyperlipidemia, unspecified: Secondary | ICD-10-CM | POA: Diagnosis not present

## 2021-04-07 DIAGNOSIS — I10 Essential (primary) hypertension: Secondary | ICD-10-CM | POA: Diagnosis not present

## 2021-04-07 DIAGNOSIS — E1169 Type 2 diabetes mellitus with other specified complication: Secondary | ICD-10-CM | POA: Diagnosis not present

## 2021-04-07 DIAGNOSIS — I259 Chronic ischemic heart disease, unspecified: Secondary | ICD-10-CM | POA: Diagnosis not present

## 2021-04-07 DIAGNOSIS — K219 Gastro-esophageal reflux disease without esophagitis: Secondary | ICD-10-CM | POA: Diagnosis not present

## 2021-04-07 DIAGNOSIS — E119 Type 2 diabetes mellitus without complications: Secondary | ICD-10-CM | POA: Diagnosis not present

## 2021-04-07 DIAGNOSIS — E785 Hyperlipidemia, unspecified: Secondary | ICD-10-CM | POA: Diagnosis not present

## 2021-05-14 ENCOUNTER — Encounter: Payer: Self-pay | Admitting: *Deleted

## 2021-05-14 ENCOUNTER — Encounter: Payer: Self-pay | Admitting: Interventional Cardiology

## 2021-05-14 ENCOUNTER — Other Ambulatory Visit: Payer: Self-pay

## 2021-05-14 ENCOUNTER — Ambulatory Visit (INDEPENDENT_AMBULATORY_CARE_PROVIDER_SITE_OTHER): Payer: Medicare Other | Admitting: Interventional Cardiology

## 2021-05-14 VITALS — BP 118/78 | HR 69 | Ht 72.0 in | Wt 213.6 lb

## 2021-05-14 DIAGNOSIS — I10 Essential (primary) hypertension: Secondary | ICD-10-CM

## 2021-05-14 DIAGNOSIS — Z794 Long term (current) use of insulin: Secondary | ICD-10-CM

## 2021-05-14 DIAGNOSIS — I259 Chronic ischemic heart disease, unspecified: Secondary | ICD-10-CM | POA: Diagnosis not present

## 2021-05-14 DIAGNOSIS — E1159 Type 2 diabetes mellitus with other circulatory complications: Secondary | ICD-10-CM | POA: Diagnosis not present

## 2021-05-14 DIAGNOSIS — I700 Atherosclerosis of aorta: Secondary | ICD-10-CM

## 2021-05-14 DIAGNOSIS — I2510 Atherosclerotic heart disease of native coronary artery without angina pectoris: Secondary | ICD-10-CM | POA: Diagnosis not present

## 2021-05-14 DIAGNOSIS — R072 Precordial pain: Secondary | ICD-10-CM

## 2021-05-14 DIAGNOSIS — I2584 Coronary atherosclerosis due to calcified coronary lesion: Secondary | ICD-10-CM | POA: Diagnosis not present

## 2021-05-14 NOTE — Progress Notes (Signed)
Cardiology Office Note   Date:  05/14/2021   ID:  Christian Robles 08/01/1951, MRN [id number]  PCP:  Lavone Orn, MD    No chief complaint on file.  Coronary artery calcification  Wt Readings from Last 3 Encounters:  05/14/21 213 lb 9.6 oz (96.9 kg)  09/22/18 206 lb (93.4 kg)  10/29/15 225 lb (102.1 kg)       History of Present Illness: Christian Robles is a 70 y.o. male who is being seen today for the evaluation of coronary artery calcification at the request of Lavone Orn, MD.  He had a calcium scoring study done in April 2022 showing: "Left Main: No coronary artery calcification  LAD: 490  LCx: 49  RCA: 1243  CORONARY CALCIUM  Total Agatston Score: 1782  So I am database percentile: 93  Ascending aorta (normal <  40 mm): 37 mm"   He on occasion can have some tightness in the chest with exertion.   Past Medical History:  Diagnosis Date  . Adenomatous polyp of colon   . Back pain   . Chest pain   . DDD (degenerative disc disease), lumbar   . Diabetes mellitus without complication ([place])   . Diverticulitis   . Diverticulosis of colon   . Dyslipidemia   . Elevated coronary artery calcium score   . Erectile dysfunction   . Erectile dysfunction due to arterial insufficiency   . Esophageal reflux   . GERD (gastroesophageal reflux disease)   . History of colonic polyps   . Hypertension   . 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 Ischemic heart disease   . Kidney stone   . Nephrolithiasis   . Primary osteoarthritis of left foot   . Rhinitis   . Spondylosis of cervical region without myelopathy or radiculopathy   . Statin intolerance   . Syncope     Past Surgical History:  Procedure Laterality Date  . CARPAL TUNNEL RELEASE    . COLONOSCOPY WITH PROPOFOL N/A 10/29/2015   Procedure: COLONOSCOPY WITH PROPOFOL;  Surgeon: Garlan Fair, MD;  Location: [facility];  Service: Endoscopy;  Laterality: N/A;  . EYE  SURGERY    . ROTATOR CUFF REPAIR       Current Outpatient Medications  Medication Sig Dispense Refill  . metFORMIN (GLUCOPHAGE) 500 MG tablet Take 500 mg by mouth daily.  3  . Multiple Vitamins-Minerals (MULTIVITAMIN PO) Take 1 packet by mouth See admin instructions. Multivitamin-minerals packet from [facility]    . omeprazole (PRILOSEC OTC) 20 MG tablet Take 10 mg by mouth daily.    Marland Kitchen PRALUENT 150 MG/ML SOAJ Inject into the skin.    . tamsulosin (FLOMAX) 0.4 MG CAPS capsule Take 1 capsule (0.4 mg total) by mouth daily. 30 capsule 0  . valsartan (DIOVAN) 160 MG tablet Take 160 mg by mouth daily.     No current facility-administered medications for this visit.    Allergies:   Meloxicam, Pravastatin, and Rosuvastatin    Social History:  The patient  reports that he has never smoked. He has never used smokeless tobacco. He reports that he does not drink alcohol and does not use drugs.   Family History:  The patient's family history includes Hypertension in his father and mother.    ROS:  Please see the history of present illness.   Otherwise, review of systems are positive for statin intolerance- severe weaknes with statins.   All other systems are reviewed and  negative.    PHYSICAL EXAM: VS:  BP 118/78   Pulse 69   Ht 6' (1.829 m)   Wt 213 lb 9.6 oz (96.9 kg)   SpO2 96%   BMI 28.97 kg/m  , BMI Body mass index is 28.97 kg/m. GEN: Well nourished, well developed, in no acute distress  HEENT: normal  Neck: no JVD, carotid bruits, or masses Cardiac: RRR; no murmurs, rubs, or gallops,no edema  Respiratory:  clear to auscultation bilaterally, normal work of breathing GI: soft, nontender, nondistended, + BS MS: no deformity or atrophy  Skin: warm and dry, no rash Neuro:  Strength and sensation are intact Psych: euthymic mood, full affect   EKG:   The ekg ordered today demonstrates NSR, no ST changes   Recent Labs: No results found for requested labs within last 8760 hours.    Lipid Panel No results found for: CHOL, TRIG, HDL, CHOLHDL, VLDL, LDLCALC, LDLDIRECT   Other studies Reviewed: Additional studies/ records that were reviewed today with results demonstrating: labs reviewed.   ASSESSMENT AND PLAN:  1. Coronary calcification: No clear exertional angina.  He lifts a lot at work and has not issues while there.  No problems on the elliptical as well, but does have some chest pains at more random times.  Given that he has high calcium score and coronary calcification, plan for exercise myoview.   2. Hyperlipidemia: LDL 104 in 2021.  Given statin intolerance.  Now on Praluent.  3. HTN: The current medical regimen is effective;  continue present plan and medications.  Low salt diet.  Well controled on ARB.  4. DM: A1C 7.1.  Whole food, plant based diet.  Continue metformin. 70. Father with AAA.  He has aortic atherosclerosis.  No AAA noted on CT renal stone.  Current medicines are reviewed at length with the patient today.  The patient concerns regarding his medicines were addressed.  The following changes have been made:  No change  Labs/ tests ordered today include:   Orders Placed This Encounter  Procedures  . MYOCARDIAL PERFUSION IMAGING  . EKG 12-Lead    Recommend 150 minutes/week of aerobic exercise Low fat, low carb, high fiber diet recommended  Disposition:   FU for stress test   Signed, Larae Grooms, MD  05/14/2021 4:29 PM    [facility] [place], [place], [place]  [address] Phone: [phone]; Fax: [phone]

## 2021-05-14 NOTE — Patient Instructions (Signed)
Medication Instructions:  Your physician recommends that you continue on your current medications as directed. Please refer to the Current Medication list given to you today.  *If you need a refill on your cardiac medications before your next appointment, please call your pharmacy*   Lab Work: none If you have labs (blood work) drawn today and your tests are completely normal, you will receive your results only by: Marland Kitchen MyChart Message (if you have MyChart) OR . A paper copy in the mail If you have any lab test that is abnormal or we need to change your treatment, we will call you to review the results.   Testing/Procedures: Your physician has requested that you have an exercise stress myoview. For further information please visit [email or web address]. Please follow instruction sheet, as given.    Follow-Up: At [facility], you and your health needs are our priority.  As part of our continuing mission to provide you with exceptional heart care, we have created designated Provider Care Teams.  These Care Teams include your primary Cardiologist (physician) and Advanced Practice Providers (APPs -  Physician Assistants and Nurse Practitioners) who all work together to provide you with the care you need, when you need it.  We recommend signing up for the patient portal called "MyChart".  Sign up information is provided on this After Visit Summary.  MyChart is used to connect with patients for Virtual Visits (Telemedicine).  Patients are able to view lab/test results, encounter notes, upcoming appointments, etc.  Non-urgent messages can be sent to your provider as well.   To learn more about what you can do with MyChart, go to [email or web address].    Your next appointment:   12 month(s)  The format for your next appointment:   In Person  Provider:   You may see Larae Grooms, MD or one of the following Advanced Practice Providers on your designated Care Team:    Melina Copa,  PA-C  Ermalinda Barrios, PA-C    Other Instructions

## 2021-05-20 ENCOUNTER — Telehealth (HOSPITAL_COMMUNITY): Payer: Self-pay | Admitting: *Deleted

## 2021-05-20 NOTE — Telephone Encounter (Signed)
Left message on voicemail per DPR in reference to upcoming appointment scheduled on 6/22/22with detailed instructions given per Myocardial Perfusion Study Information Sheet for the test. LM to arrive 15 minutes early, and that it is imperative to arrive on time for appointment to keep from having the test rescheduled. If you need to cancel or reschedule your appointment, please call the office within 24 hours of your appointment. Failure to do so may result in a cancellation of your appointment, and a $50 no show fee. Phone number given for call back for any questions.  Kirstie Peri

## 2021-05-21 DIAGNOSIS — M180 Bilateral primary osteoarthritis of first carpometacarpal joints: Secondary | ICD-10-CM | POA: Diagnosis not present

## 2021-05-28 ENCOUNTER — Ambulatory Visit (HOSPITAL_COMMUNITY): Payer: Medicare Other | Attending: Cardiology

## 2021-05-28 ENCOUNTER — Other Ambulatory Visit: Payer: Self-pay

## 2021-05-28 DIAGNOSIS — R072 Precordial pain: Secondary | ICD-10-CM | POA: Insufficient documentation

## 2021-05-28 LAB — MYOCARDIAL PERFUSION IMAGING
Estimated workload: 10.1 METS
Exercise duration (min): 9 min
Exercise duration (sec): 0 s
LV dias vol: 82 mL (ref 62–150)
LV sys vol: 38 mL
MPHR: 151 {beats}/min
Peak HR: 142 {beats}/min
Percent HR: 94 %
Rest HR: 68 {beats}/min
SDS: 0
SRS: 0
SSS: 0
TID: 0.88

## 2021-05-28 MED ORDER — TECHNETIUM TC 99M TETROFOSMIN IV KIT
10.5000 | PACK | Freq: Once | INTRAVENOUS | Status: AC | PRN
  Administered 2021-05-28: 10.5 via INTRAVENOUS
  Filled 2021-05-28: qty 11

## 2021-05-28 MED ORDER — TECHNETIUM TC 99M TETROFOSMIN IV KIT
33.0000 | PACK | Freq: Once | INTRAVENOUS | Status: AC | PRN
  Administered 2021-05-28: 33 via INTRAVENOUS
  Filled 2021-05-28: qty 33

## 2021-06-17 DIAGNOSIS — M19049 Primary osteoarthritis, unspecified hand: Secondary | ICD-10-CM | POA: Diagnosis not present

## 2021-06-17 DIAGNOSIS — M1811 Unilateral primary osteoarthritis of first carpometacarpal joint, right hand: Secondary | ICD-10-CM | POA: Diagnosis not present

## 2021-07-04 DIAGNOSIS — K219 Gastro-esophageal reflux disease without esophagitis: Secondary | ICD-10-CM | POA: Diagnosis not present

## 2021-07-04 DIAGNOSIS — N400 Enlarged prostate without lower urinary tract symptoms: Secondary | ICD-10-CM | POA: Diagnosis not present

## 2021-07-04 DIAGNOSIS — Z789 Other specified health status: Secondary | ICD-10-CM | POA: Diagnosis not present

## 2021-07-04 DIAGNOSIS — E785 Hyperlipidemia, unspecified: Secondary | ICD-10-CM | POA: Diagnosis not present

## 2021-07-04 DIAGNOSIS — R931 Abnormal findings on diagnostic imaging of heart and coronary circulation: Secondary | ICD-10-CM | POA: Diagnosis not present

## 2021-07-04 DIAGNOSIS — Z1389 Encounter for screening for other disorder: Secondary | ICD-10-CM | POA: Diagnosis not present

## 2021-07-04 DIAGNOSIS — E119 Type 2 diabetes mellitus without complications: Secondary | ICD-10-CM | POA: Diagnosis not present

## 2021-07-04 DIAGNOSIS — Z0000 Encounter for general adult medical examination without abnormal findings: Secondary | ICD-10-CM | POA: Diagnosis not present

## 2021-07-04 DIAGNOSIS — R0683 Snoring: Secondary | ICD-10-CM | POA: Diagnosis not present

## 2021-07-04 DIAGNOSIS — I10 Essential (primary) hypertension: Secondary | ICD-10-CM | POA: Diagnosis not present

## 2021-08-28 DIAGNOSIS — Z23 Encounter for immunization: Secondary | ICD-10-CM | POA: Diagnosis not present

## 2021-09-16 DIAGNOSIS — M19049 Primary osteoarthritis, unspecified hand: Secondary | ICD-10-CM | POA: Diagnosis not present

## 2021-09-16 DIAGNOSIS — M1811 Unilateral primary osteoarthritis of first carpometacarpal joint, right hand: Secondary | ICD-10-CM | POA: Diagnosis not present

## 2021-10-13 DIAGNOSIS — M654 Radial styloid tenosynovitis [de Quervain]: Secondary | ICD-10-CM | POA: Diagnosis not present

## 2021-10-13 DIAGNOSIS — M180 Bilateral primary osteoarthritis of first carpometacarpal joints: Secondary | ICD-10-CM | POA: Diagnosis not present

## 2021-10-13 DIAGNOSIS — M1812 Unilateral primary osteoarthritis of first carpometacarpal joint, left hand: Secondary | ICD-10-CM | POA: Diagnosis not present

## 2021-10-16 DIAGNOSIS — M19042 Primary osteoarthritis, left hand: Secondary | ICD-10-CM | POA: Diagnosis not present

## 2021-10-16 DIAGNOSIS — M1811 Unilateral primary osteoarthritis of first carpometacarpal joint, right hand: Secondary | ICD-10-CM | POA: Diagnosis not present

## 2021-10-27 DIAGNOSIS — K219 Gastro-esophageal reflux disease without esophagitis: Secondary | ICD-10-CM | POA: Diagnosis not present

## 2021-10-27 DIAGNOSIS — N400 Enlarged prostate without lower urinary tract symptoms: Secondary | ICD-10-CM | POA: Diagnosis not present

## 2021-10-27 DIAGNOSIS — E119 Type 2 diabetes mellitus without complications: Secondary | ICD-10-CM | POA: Diagnosis not present

## 2021-10-27 DIAGNOSIS — E0869 Diabetes mellitus due to underlying condition with other specified complication: Secondary | ICD-10-CM | POA: Diagnosis not present

## 2021-10-27 DIAGNOSIS — M19072 Primary osteoarthritis, left ankle and foot: Secondary | ICD-10-CM | POA: Diagnosis not present

## 2021-10-27 DIAGNOSIS — I10 Essential (primary) hypertension: Secondary | ICD-10-CM | POA: Diagnosis not present

## 2021-10-27 DIAGNOSIS — E1169 Type 2 diabetes mellitus with other specified complication: Secondary | ICD-10-CM | POA: Diagnosis not present

## 2021-10-27 DIAGNOSIS — E785 Hyperlipidemia, unspecified: Secondary | ICD-10-CM | POA: Diagnosis not present

## 2021-10-27 DIAGNOSIS — M47812 Spondylosis without myelopathy or radiculopathy, cervical region: Secondary | ICD-10-CM | POA: Diagnosis not present

## 2021-11-13 DIAGNOSIS — N400 Enlarged prostate without lower urinary tract symptoms: Secondary | ICD-10-CM | POA: Diagnosis not present

## 2021-11-13 DIAGNOSIS — I10 Essential (primary) hypertension: Secondary | ICD-10-CM | POA: Diagnosis not present

## 2021-11-13 DIAGNOSIS — E1169 Type 2 diabetes mellitus with other specified complication: Secondary | ICD-10-CM | POA: Diagnosis not present

## 2021-11-13 DIAGNOSIS — M47812 Spondylosis without myelopathy or radiculopathy, cervical region: Secondary | ICD-10-CM | POA: Diagnosis not present

## 2021-11-13 DIAGNOSIS — M19072 Primary osteoarthritis, left ankle and foot: Secondary | ICD-10-CM | POA: Diagnosis not present

## 2021-11-13 DIAGNOSIS — E785 Hyperlipidemia, unspecified: Secondary | ICD-10-CM | POA: Diagnosis not present

## 2021-11-13 DIAGNOSIS — I259 Chronic ischemic heart disease, unspecified: Secondary | ICD-10-CM | POA: Diagnosis not present

## 2021-11-13 DIAGNOSIS — K219 Gastro-esophageal reflux disease without esophagitis: Secondary | ICD-10-CM | POA: Diagnosis not present

## 2021-11-13 DIAGNOSIS — E119 Type 2 diabetes mellitus without complications: Secondary | ICD-10-CM | POA: Diagnosis not present

## 2021-11-14 DIAGNOSIS — M1811 Unilateral primary osteoarthritis of first carpometacarpal joint, right hand: Secondary | ICD-10-CM | POA: Diagnosis not present

## 2021-12-12 DIAGNOSIS — M1811 Unilateral primary osteoarthritis of first carpometacarpal joint, right hand: Secondary | ICD-10-CM | POA: Diagnosis not present

## 2021-12-12 DIAGNOSIS — M25642 Stiffness of left hand, not elsewhere classified: Secondary | ICD-10-CM | POA: Diagnosis not present

## 2021-12-12 DIAGNOSIS — R29898 Other symptoms and signs involving the musculoskeletal system: Secondary | ICD-10-CM | POA: Diagnosis not present

## 2021-12-12 DIAGNOSIS — Z789 Other specified health status: Secondary | ICD-10-CM | POA: Diagnosis not present

## 2021-12-12 DIAGNOSIS — M25632 Stiffness of left wrist, not elsewhere classified: Secondary | ICD-10-CM | POA: Diagnosis not present

## 2022-01-01 DIAGNOSIS — R0683 Snoring: Secondary | ICD-10-CM | POA: Diagnosis not present

## 2022-01-01 DIAGNOSIS — E0869 Diabetes mellitus due to underlying condition with other specified complication: Secondary | ICD-10-CM | POA: Diagnosis not present

## 2022-01-01 DIAGNOSIS — G720 Drug-induced myopathy: Secondary | ICD-10-CM | POA: Diagnosis not present

## 2022-01-01 DIAGNOSIS — E119 Type 2 diabetes mellitus without complications: Secondary | ICD-10-CM | POA: Diagnosis not present

## 2022-01-01 DIAGNOSIS — N400 Enlarged prostate without lower urinary tract symptoms: Secondary | ICD-10-CM | POA: Diagnosis not present

## 2022-01-01 DIAGNOSIS — T466X5A Adverse effect of antihyperlipidemic and antiarteriosclerotic drugs, initial encounter: Secondary | ICD-10-CM | POA: Diagnosis not present

## 2022-01-01 DIAGNOSIS — I10 Essential (primary) hypertension: Secondary | ICD-10-CM | POA: Diagnosis not present

## 2022-01-01 DIAGNOSIS — R931 Abnormal findings on diagnostic imaging of heart and coronary circulation: Secondary | ICD-10-CM | POA: Diagnosis not present

## 2022-01-05 DIAGNOSIS — M1811 Unilateral primary osteoarthritis of first carpometacarpal joint, right hand: Secondary | ICD-10-CM | POA: Diagnosis not present

## 2022-01-05 DIAGNOSIS — M654 Radial styloid tenosynovitis [de Quervain]: Secondary | ICD-10-CM | POA: Diagnosis not present

## 2022-01-05 DIAGNOSIS — M180 Bilateral primary osteoarthritis of first carpometacarpal joints: Secondary | ICD-10-CM | POA: Diagnosis not present

## 2022-01-08 DIAGNOSIS — Z4789 Encounter for other orthopedic aftercare: Secondary | ICD-10-CM | POA: Diagnosis not present

## 2022-03-12 DIAGNOSIS — H25013 Cortical age-related cataract, bilateral: Secondary | ICD-10-CM | POA: Diagnosis not present

## 2022-05-02 IMAGING — MR MR MRA HEAD W/O CM
2 series · 20 of 48 positions shown · non-contrast
Comparison: None available.

CLINICAL DATA: Initial evaluation for recent episode of acute
nausea, dizziness, sweating.



[Series 3: tof_3d_multi-slab · axial · 0.5mm · 0.35mm/px · z∈[-93,+23]mm · 18 of 243 slices shown]
[im 1/243]
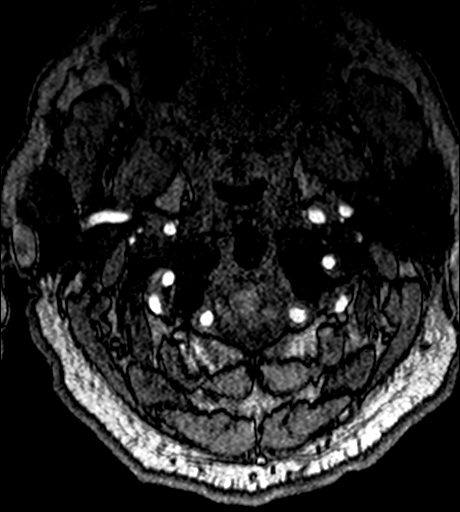
[im 6/243]
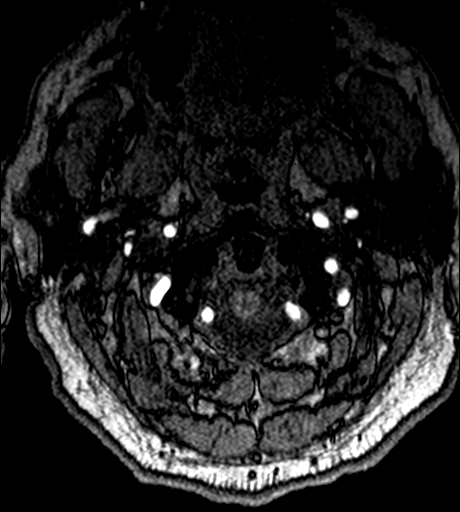
[im 11/243]
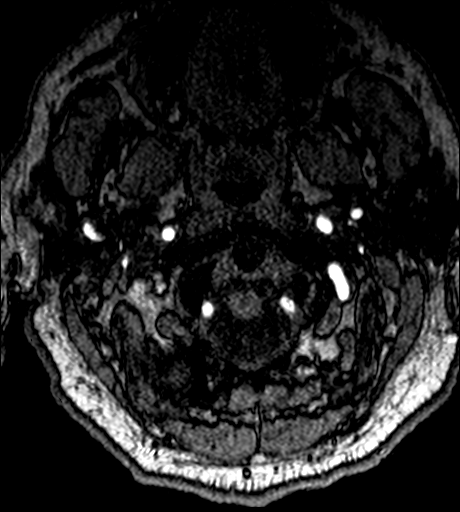
[im 17/243]
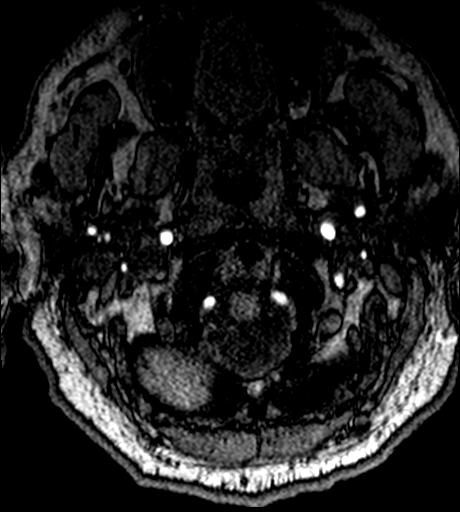
[im 22/243]
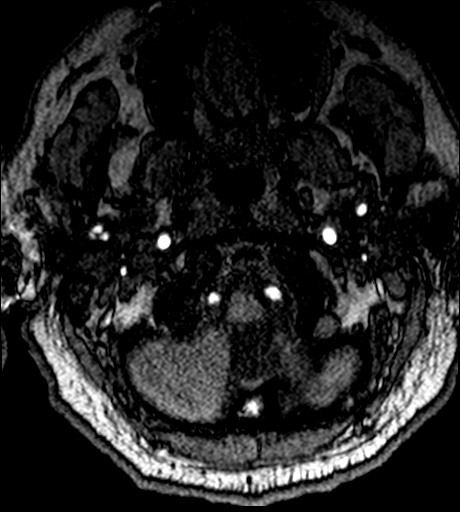
[im 27/243]
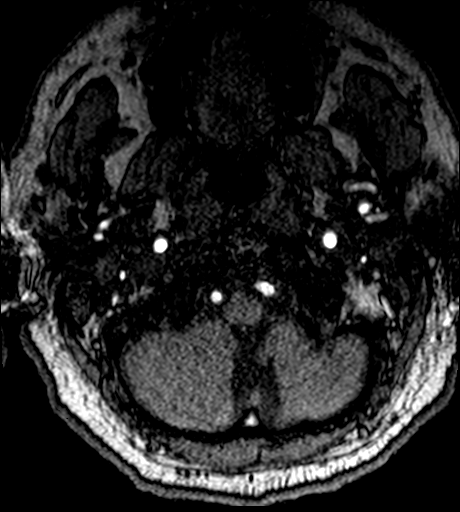
[im 33/243]
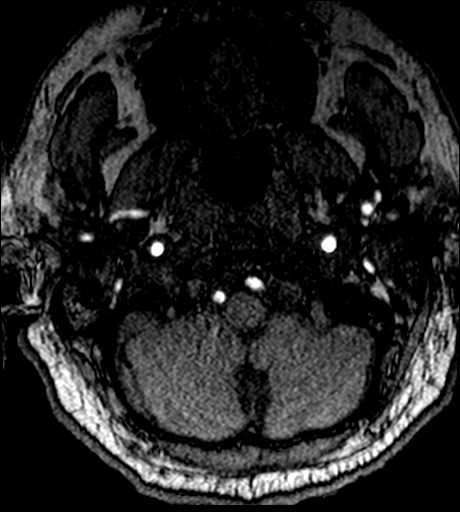
[im 38/243]
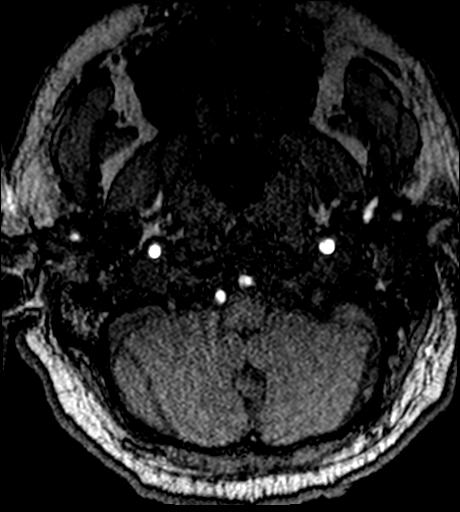
[im 44/243]
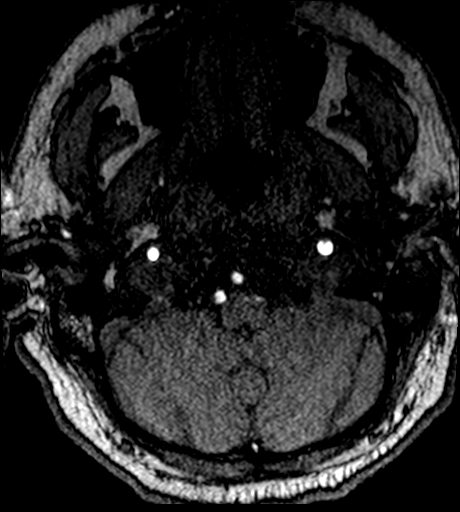
[im 49/243]
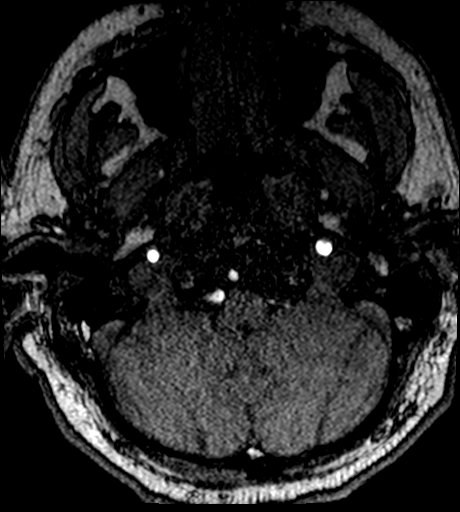
[im 76/243]
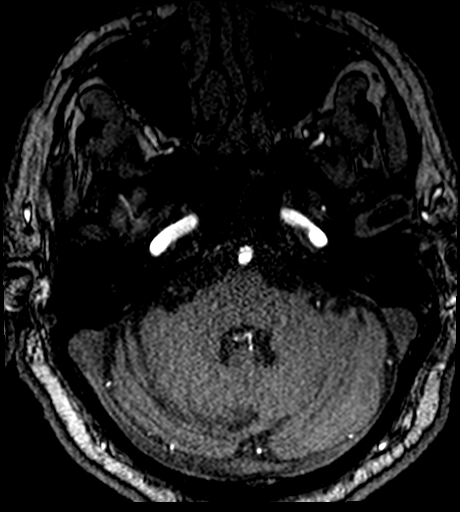
[im 108/243]
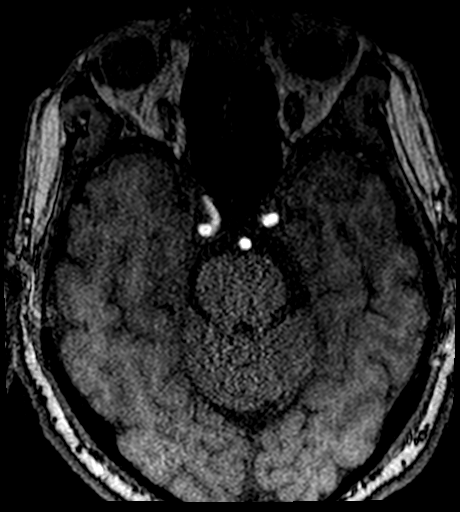
[im 124/243]
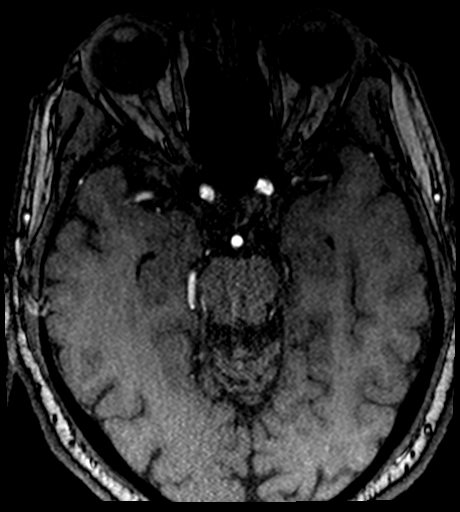
[im 135/243]
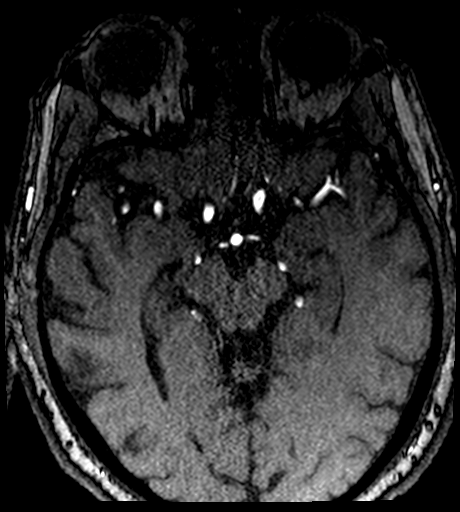
[im 167/243]
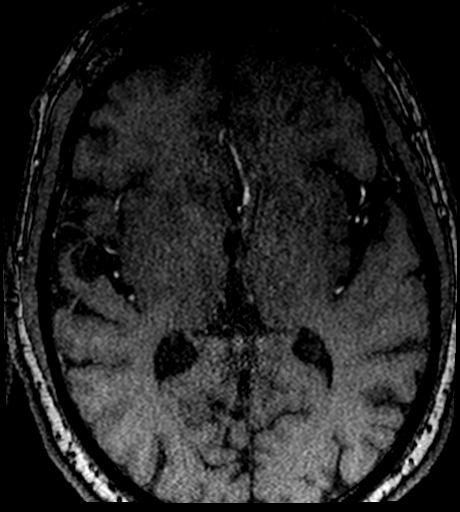
[im 199/243]
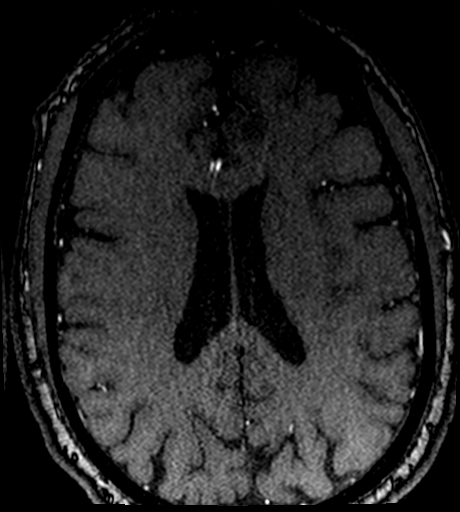
[im 205/243]
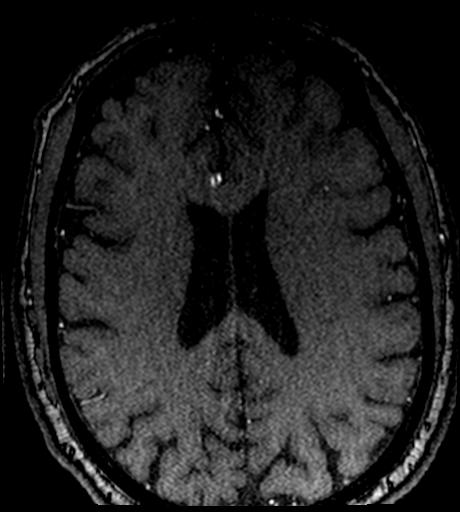
[im 232/243]
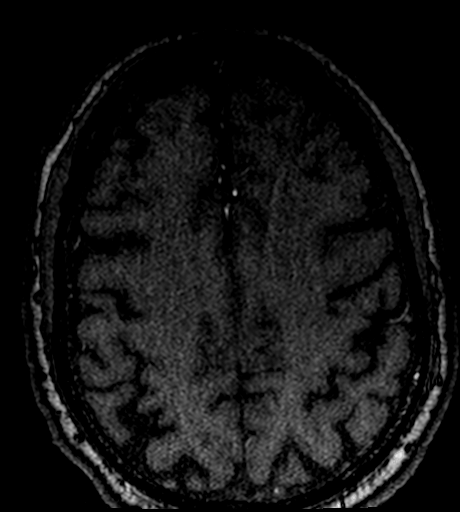

[Series 106: hx · axial · 8.0mm · 1.17mm/px · z∈[-207,+177]mm · 2 of 12 slices shown]
[im 1/12]
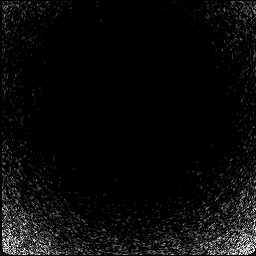
[im 12/12]
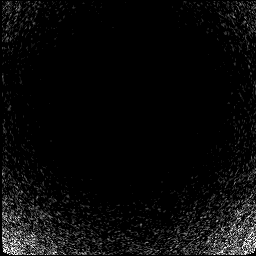

[20 of 48 positions shown; findings below may reference images not displayed]

FINDINGS: MRI HEAD FINDINGS

Brain: Cerebral volume within normal limits for patient age. No
focal parenchymal signal abnormality identified.

No abnormal foci of restricted diffusion to suggest acute or
subacute ischemia. Gray-white matter differentiation well
maintained. No encephalomalacia to suggest chronic infarction. No
foci of susceptibility artifact to suggest acute or chronic
intracranial hemorrhage.

No mass lesion, midline shift or mass effect. No hydrocephalus. No
extra-axial fluid collection. Major dural sinuses are grossly
patent.

Pituitary gland and suprasellar region are normal. Midline
structures intact and normal.

Vascular: Major intracranial vascular flow voids well maintained and
normal in appearance.

Skull and upper cervical spine: Craniocervical junction normal.
Visualized upper cervical spine within normal limits. Bone marrow
signal intensity normal. No scalp soft tissue abnormality.

Sinuses/Orbits: Globes and orbital soft tissues within normal
limits.

Paranasal sinuses are clear. No mastoid effusion. Inner ear
structures normal.

Other: None.

MRA HEAD FINDINGS

ANTERIOR CIRCULATION:

Visualized distal cervical segments of the internal carotid arteries
widely patent with symmetric antegrade flow. Petrous, cavernous, and
supraclinoid ICAs widely patent without stenosis or other
abnormality. ICA termini well perfused. A1 segments patent
bilaterally. Right A1 hypoplastic, accounting for the slightly
diminutive right ICA is compared to the left. Ectatic anterior
communicating artery complex with short-segment azygos ACA noted.
Anterior cerebral arteries widely patent distally without stenosis
or other abnormality. No M1 stenosis or occlusion. Normal MCA
bifurcations. Distal MCA branches well perfused and symmetric.

POSTERIOR CIRCULATION:

Vertebral arteries largely codominant and widely patent to the
vertebrobasilar junction. Both picas patent. Basilar widely patent
to its distal aspect without stenosis. Superior cerebral arteries
patent bilaterally. Both PCAs primarily supplied via the basilar
well perfused to their distal aspects without stenosis.

No intracranial aneurysm or other vascular abnormality.

MRA NECK FINDINGS

AORTIC ARCH: Examination mildly degraded by lack of IV contrast.

Aortic arch and origin of the great vessels not included on this
exam.

RIGHT CAROTID SYSTEM: Visualized right CCA widely patent to the
bifurcation without stenosis. No significant atheromatous narrowing
about the right bifurcation. Right ICA widely patent distally
without stenosis or vascular occlusion.

LEFT CAROTID SYSTEM: Visualized left CCA widely patent to the
bifurcation without stenosis. No significant atheromatous narrowing
of the left bifurcation. Left ICA widely patent distally without
stenosis or vascular occlusion.

VERTEBRAL ARTERIES: Visualized vertebral arteries codominant and
widely patent with antegrade flow. No flow-limiting stenosis or
vascular occlusion.
IMPRESSION: 1. Normal brain MRI. No acute intracranial abnormality identified.
2. Normal intracranial MRA.
3. Normal MRA of the neck.

## 2022-07-09 DIAGNOSIS — I2510 Atherosclerotic heart disease of native coronary artery without angina pectoris: Secondary | ICD-10-CM | POA: Diagnosis not present

## 2022-07-09 DIAGNOSIS — K219 Gastro-esophageal reflux disease without esophagitis: Secondary | ICD-10-CM | POA: Diagnosis not present

## 2022-07-09 DIAGNOSIS — R931 Abnormal findings on diagnostic imaging of heart and coronary circulation: Secondary | ICD-10-CM | POA: Diagnosis not present

## 2022-07-09 DIAGNOSIS — N400 Enlarged prostate without lower urinary tract symptoms: Secondary | ICD-10-CM | POA: Diagnosis not present

## 2022-07-09 DIAGNOSIS — I10 Essential (primary) hypertension: Secondary | ICD-10-CM | POA: Diagnosis not present

## 2022-07-09 DIAGNOSIS — M79671 Pain in right foot: Secondary | ICD-10-CM | POA: Diagnosis not present

## 2022-07-09 DIAGNOSIS — E785 Hyperlipidemia, unspecified: Secondary | ICD-10-CM | POA: Diagnosis not present

## 2022-07-09 DIAGNOSIS — M79672 Pain in left foot: Secondary | ICD-10-CM | POA: Diagnosis not present

## 2022-07-09 DIAGNOSIS — Z125 Encounter for screening for malignant neoplasm of prostate: Secondary | ICD-10-CM | POA: Diagnosis not present

## 2022-07-09 DIAGNOSIS — E1169 Type 2 diabetes mellitus with other specified complication: Secondary | ICD-10-CM | POA: Diagnosis not present

## 2023-01-13 DIAGNOSIS — M79674 Pain in right toe(s): Secondary | ICD-10-CM | POA: Diagnosis not present

## 2023-01-13 DIAGNOSIS — N400 Enlarged prostate without lower urinary tract symptoms: Secondary | ICD-10-CM | POA: Diagnosis not present

## 2023-01-13 DIAGNOSIS — E1169 Type 2 diabetes mellitus with other specified complication: Secondary | ICD-10-CM | POA: Diagnosis not present

## 2023-01-13 DIAGNOSIS — Z6832 Body mass index (BMI) 32.0-32.9, adult: Secondary | ICD-10-CM | POA: Diagnosis not present

## 2023-01-13 DIAGNOSIS — E6609 Other obesity due to excess calories: Secondary | ICD-10-CM | POA: Diagnosis not present

## 2023-01-13 DIAGNOSIS — I10 Essential (primary) hypertension: Secondary | ICD-10-CM | POA: Diagnosis not present

## 2023-01-13 DIAGNOSIS — M79675 Pain in left toe(s): Secondary | ICD-10-CM | POA: Diagnosis not present

## 2023-01-13 DIAGNOSIS — Z125 Encounter for screening for malignant neoplasm of prostate: Secondary | ICD-10-CM | POA: Diagnosis not present

## 2023-01-13 DIAGNOSIS — E785 Hyperlipidemia, unspecified: Secondary | ICD-10-CM | POA: Diagnosis not present

## 2023-01-13 DIAGNOSIS — I2510 Atherosclerotic heart disease of native coronary artery without angina pectoris: Secondary | ICD-10-CM | POA: Diagnosis not present

## 2023-01-13 DIAGNOSIS — K219 Gastro-esophageal reflux disease without esophagitis: Secondary | ICD-10-CM | POA: Diagnosis not present

## 2023-01-13 DIAGNOSIS — R5383 Other fatigue: Secondary | ICD-10-CM | POA: Diagnosis not present

## 2023-01-21 DIAGNOSIS — E291 Testicular hypofunction: Secondary | ICD-10-CM | POA: Diagnosis not present

## 2023-02-25 DIAGNOSIS — E291 Testicular hypofunction: Secondary | ICD-10-CM | POA: Diagnosis not present

## 2023-02-25 DIAGNOSIS — N529 Male erectile dysfunction, unspecified: Secondary | ICD-10-CM | POA: Diagnosis not present

## 2023-04-08 DIAGNOSIS — M65342 Trigger finger, left ring finger: Secondary | ICD-10-CM | POA: Diagnosis not present

## 2023-04-30 DIAGNOSIS — R972 Elevated prostate specific antigen [PSA]: Secondary | ICD-10-CM | POA: Diagnosis not present

## 2023-04-30 DIAGNOSIS — N529 Male erectile dysfunction, unspecified: Secondary | ICD-10-CM | POA: Diagnosis not present

## 2023-05-06 DIAGNOSIS — M1811 Unilateral primary osteoarthritis of first carpometacarpal joint, right hand: Secondary | ICD-10-CM | POA: Diagnosis not present

## 2023-05-06 DIAGNOSIS — M65342 Trigger finger, left ring finger: Secondary | ICD-10-CM | POA: Diagnosis not present

## 2023-05-06 DIAGNOSIS — M19042 Primary osteoarthritis, left hand: Secondary | ICD-10-CM | POA: Diagnosis not present

## 2023-07-16 DIAGNOSIS — H2513 Age-related nuclear cataract, bilateral: Secondary | ICD-10-CM | POA: Diagnosis not present

## 2023-08-27 DIAGNOSIS — R972 Elevated prostate specific antigen [PSA]: Secondary | ICD-10-CM | POA: Diagnosis not present

## 2023-09-03 DIAGNOSIS — N5201 Erectile dysfunction due to arterial insufficiency: Secondary | ICD-10-CM | POA: Diagnosis not present

## 2023-09-21 DIAGNOSIS — Z23 Encounter for immunization: Secondary | ICD-10-CM | POA: Diagnosis not present

## 2024-07-06 ENCOUNTER — Telehealth: Payer: Self-pay | Admitting: Pharmacist

## 2024-07-06 NOTE — Progress Notes (Signed)
   07/06/2024  Patient ID: Christian Robles, male   DOB: 01-24-1951, 73 y.o.   MRN: [id number]  From Dr. Charlott: Hello Christian Robles. This patient called in stating that his [employer] grant needs to be renewed. Are you able to rerun this for his Praluent?  Grant renewal completed. Information can be found within his documents regarding the card. Called the pharmacy and had medication filled with grant card for 110-month supply at no charge. Will work on getting it filled now. May not arrive until Monday though. Called and left voicemail for the patient regarding this information.    Aloysius Lewis, PharmD [facility]  Phone Number: [phone]

## 2024-09-04 ENCOUNTER — Ambulatory Visit (INDEPENDENT_AMBULATORY_CARE_PROVIDER_SITE_OTHER)

## 2024-09-04 ENCOUNTER — Ambulatory Visit (INDEPENDENT_AMBULATORY_CARE_PROVIDER_SITE_OTHER): Admitting: Podiatry

## 2024-09-04 ENCOUNTER — Encounter: Payer: Self-pay | Admitting: Podiatry

## 2024-09-04 DIAGNOSIS — M79674 Pain in right toe(s): Secondary | ICD-10-CM

## 2024-09-04 DIAGNOSIS — G8929 Other chronic pain: Secondary | ICD-10-CM

## 2024-09-04 DIAGNOSIS — M2042 Other hammer toe(s) (acquired), left foot: Secondary | ICD-10-CM

## 2024-09-04 DIAGNOSIS — E1142 Type 2 diabetes mellitus with diabetic polyneuropathy: Secondary | ICD-10-CM

## 2024-09-04 DIAGNOSIS — M6289 Other specified disorders of muscle: Secondary | ICD-10-CM

## 2024-09-04 DIAGNOSIS — M79675 Pain in left toe(s): Secondary | ICD-10-CM | POA: Diagnosis not present

## 2024-09-04 MED ORDER — GABAPENTIN 100 MG PO CAPS: 100.0000 mg | ORAL_CAPSULE | Freq: Three times a day (TID) | ORAL | 3 refills | Status: DC

## 2024-09-04 NOTE — Progress Notes (Unsigned)
 Chief Complaint  Patient presents with   Peripheral Neuropathy   Neuroma    Possible neuroma bilateral x 3 yrs. No improvements. NIDDM A1C 7.2 experiencing diabetic neuropathy symptoms. 2 pain.    HPI: 73 y.o. male presents today with concern of pain and occasional burning discomfort to the ball of both feet.  He was not sure if he had a neuroma.  He states that it has started near the big toe joint and has spread across the entire ball of the foot.  The left was worse than the right.  Denies injury.  Denies history of the symptoms previously.  States it had an insidious onset and is get slowly getting worse.  Past Medical History:  Diagnosis Date   Adenomatous polyp of colon    Back pain    Chest pain    DDD (degenerative disc disease), lumbar    Diabetes mellitus without complication (HCC)    Diverticulitis    Diverticulosis of colon    Dyslipidemia    Elevated coronary artery calcium score    Erectile dysfunction    Erectile dysfunction due to arterial insufficiency    Esophageal reflux    GERD (gastroesophageal reflux disease)    History of colonic polyps    Hypertension    Inflammatory polyps of colon without complications (HCC)    Ischemic heart disease    Kidney stone    Nephrolithiasis    Primary osteoarthritis of left foot    Rhinitis    Spondylosis of cervical region without myelopathy or radiculopathy    Statin intolerance    Syncope    Past Surgical History:  Procedure Laterality Date   CARPAL TUNNEL RELEASE     COLONOSCOPY WITH PROPOFOL  N/A 10/29/2015   Procedure: COLONOSCOPY WITH PROPOFOL ;  Surgeon: Gladis MARLA Louder, MD;  Location: [facility];  Service: Endoscopy;  Laterality: N/A;   EYE SURGERY     ROTATOR CUFF REPAIR     Allergies  Allergen Reactions   Meloxicam Other (See Comments)   Pravastatin Other (See Comments)   Rosuvastatin Other (See Comments)   Review of Systems  Neurological:  Positive for sensory change.     Physical  Exam: General: The patient is alert and oriented x3 in no acute distress.  Dermatology: Skin is warm, dry and supple bilateral lower extremities. Interspaces are clear of maceration and debris.    Vascular: Palpable pedal pulses bilaterally. Capillary refill within normal limits.  No appreciable edema.  No erythema or calor.  Neurological: Light touch sensation grossly intact bilateral feet.  Decreased vibratory sensation to the bilateral forefoot.  Protective sensation intact bilateral.  Manual muscle testing 5/5.  Musculoskeletal Exam: Mild hallux valgus with bunion deformity bilateral.  Decreased first MPJ range of motion bilateral.  Contracture of the left second toe at the PIP joint.  Ankle dorsiflexion is less than 10 degrees with the knee extended bilateral.  Radiographic Exam (bilateral foot, 3 weightbearing views, 09/04/2024):  Normal osseous mineralization.  Contracture left second toe noted at PIPJ.  Mild increase in first intermetatarsal angle bilateral.  Small dorsal spur at the left first metatarsal head.  Decreased calcaneal inclination angle bilateral.  Mild metatarsus adductus, mostly noted on the left foot.  Assessment/Plan of Care: 1. Type 2 diabetes mellitus with diabetic polyneuropathy, unspecified whether long term insulin use (HCC)   2. Toe pain, chronic, right   3. Toe pain, left   4. Hammertoe of left foot   5. Tightness of both gastrocnemius muscles  Meds ordered this encounter  Medications   gabapentin (NEURONTIN) 100 MG capsule    Sig: Take 1 capsule (100 mg total) by mouth 3 (three) times daily.    Dispense:  90 capsule    Refill:  3   AMB REFERRAL TO NEUROLOGY FOR HOME USE ONLY DME DIABETIC SHOE  Discussed clinical findings with patient today.  Educated patient about the diabetic shoe program.  He is interested and will like to pursue this.  Will place order with [employer] for diabetic shoes.  He qualifies for diabetic shoes due to diabetes with  peripheral neuropathy and hammertoes.  Will get him set up for a nerve conduction study of the bilateral lower extremity to rule out any other causes of his neuropathy.  Since the change in sensation has started at the ball of the foot and not in the toes themselves, this may not be truly diabetic neuropathy.  In the meantime, we will get him started on gabapentin 100 mg 1 capsule p.o. 3 times daily to see if this helps manage his symptoms.  He was instructed not to take any on the day of his EMG.  He was given stretching exercises for the calf muscles to start performing daily.  This was given at checkout.    Awanda CHARM Imperial, DPM, FACFAS [facility]     [address] [place], [place] [address]                Office [phone]  Fax [phone]

## 2024-09-04 NOTE — Patient Instructions (Signed)

## 2024-09-26 ENCOUNTER — Other Ambulatory Visit: Payer: Self-pay

## 2024-09-26 DIAGNOSIS — R202 Paresthesia of skin: Secondary | ICD-10-CM

## 2024-12-11 ENCOUNTER — Encounter: Admitting: Neurology

## 2024-12-30 ENCOUNTER — Other Ambulatory Visit: Payer: Self-pay | Admitting: Podiatry
